# Patient Record
Sex: Male | Born: 2000 | Race: Black or African American | Hispanic: No | Marital: Single | Smoking: Former smoker
Health system: Southern US, Community
[De-identification: ages and names within clinical notes are randomized; demographics above are authoritative.]

## PROBLEM LIST (undated history)

## (undated) DIAGNOSIS — B20 Human immunodeficiency virus [HIV] disease: Principal | ICD-10-CM

## (undated) DIAGNOSIS — F909 Attention-deficit hyperactivity disorder, unspecified type: Secondary | ICD-10-CM

## (undated) HISTORY — DX: Attention-deficit hyperactivity disorder, unspecified type: F90.9

## (undated) HISTORY — DX: Human immunodeficiency virus (HIV) disease: B20

---
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
## 2019-02-13 ENCOUNTER — Other Ambulatory Visit: Payer: Self-pay | Admitting: Infectious Diseases

## 2019-02-13 DIAGNOSIS — Z21 Asymptomatic human immunodeficiency virus [HIV] infection status: Secondary | ICD-10-CM

## 2019-04-07 ENCOUNTER — Other Ambulatory Visit: Payer: Medicaid Other

## 2019-04-07 ENCOUNTER — Other Ambulatory Visit: Payer: Self-pay

## 2019-04-07 DIAGNOSIS — B20 Human immunodeficiency virus [HIV] disease: Secondary | ICD-10-CM | POA: Diagnosis not present

## 2019-04-07 DIAGNOSIS — Z21 Asymptomatic human immunodeficiency virus [HIV] infection status: Secondary | ICD-10-CM

## 2019-04-08 LAB — T-HELPER CELL (CD4) - (RCID CLINIC ONLY)
CD4 % Helper T Cell: 35 % (ref 33–65)
CD4 T Cell Abs: 655 /uL (ref 400–1790)

## 2019-04-10 LAB — CBC WITH DIFFERENTIAL/PLATELET
Absolute Monocytes: 342 cells/uL (ref 200–950)
Basophils Absolute: 18 cells/uL (ref 0–200)
Basophils Relative: 0.4 %
Eosinophils Absolute: 131 cells/uL (ref 15–500)
Eosinophils Relative: 2.9 %
HCT: 42 % (ref 38.5–50.0)
Hemoglobin: 14.1 g/dL (ref 13.2–17.1)
Lymphs Abs: 1832 cells/uL (ref 850–3900)
MCH: 28.9 pg (ref 27.0–33.0)
MCHC: 33.6 g/dL (ref 32.0–36.0)
MCV: 86.1 fL (ref 80.0–100.0)
MPV: 11.5 fL (ref 7.5–12.5)
Monocytes Relative: 7.6 %
Neutro Abs: 2178 cells/uL (ref 1500–7800)
Neutrophils Relative %: 48.4 %
Platelets: 205 10*3/uL (ref 140–400)
RBC: 4.88 10*6/uL (ref 4.20–5.80)
RDW: 12.4 % (ref 11.0–15.0)
Total Lymphocyte: 40.7 %
WBC: 4.5 10*3/uL (ref 3.8–10.8)

## 2019-04-10 LAB — HIV-1 RNA QUANT-NO REFLEX-BLD
HIV 1 RNA Quant: <20 copies/mL
HIV-1 RNA Quant, Log: <1.3 Log copies/mL

## 2019-04-10 LAB — RPR: RPR Ser Ql: NONREACTIVE

## 2019-04-21 ENCOUNTER — Telehealth: Payer: Self-pay

## 2019-04-21 NOTE — Telephone Encounter (Signed)
COVID-19 Pre-Screening Questions:04/21/19  Do you currently have a fever (>100 F), chills or unexplained body aches? NO  Are you currently experiencing new cough, shortness of breath, sore throat, runny nose?NO*  .  Have you recently travelled outside the state of [place] in the last 14 days? NO .  Have you been in contact with someone that is currently pending confirmation of Covid19 testing or has been confirmed to have the Covid19 virus? NO  **If the patient answers NO to ALL questions -  advise the patient to please call the clinic before coming to the office should any symptoms develop.     

## 2019-04-22 ENCOUNTER — Ambulatory Visit (INDEPENDENT_AMBULATORY_CARE_PROVIDER_SITE_OTHER): Payer: Medicaid Other | Admitting: Infectious Diseases

## 2019-04-22 ENCOUNTER — Encounter: Payer: Self-pay | Admitting: Infectious Diseases

## 2019-04-22 ENCOUNTER — Other Ambulatory Visit: Payer: Self-pay

## 2019-04-22 DIAGNOSIS — R234 Changes in skin texture: Secondary | ICD-10-CM

## 2019-04-22 DIAGNOSIS — Z21 Asymptomatic human immunodeficiency virus [HIV] infection status: Secondary | ICD-10-CM | POA: Diagnosis not present

## 2019-04-22 DIAGNOSIS — Z8619 Personal history of other infectious and parasitic diseases: Secondary | ICD-10-CM | POA: Diagnosis not present

## 2019-04-22 MED ORDER — SYMTUZA 800-150-200-10 MG PO TABS: 1.0000 | ORAL_TABLET | Freq: Every day | ORAL | 3 refills | Status: DC

## 2019-04-22 MED ORDER — TERBINAFINE HCL 1 % EX CREA: 1.0000 "application " | TOPICAL_CREAM | Freq: Two times a day (BID) | CUTANEOUS | 0 refills | Status: DC

## 2019-04-22 NOTE — Progress Notes (Signed)
Name: Jose Davies  DOB: 15-Aug-2000  MRN: [id number]  PCP: [facility]    Patient Active Problem List   Diagnosis Date Noted  . Fissure in skin of both feet 04/24/2019  . History of syphilis 03/10/2017  . Healthcare maintenance 02/02/2017  . HIV (human immunodeficiency virus infection) ([place]) 01/30/2017  . ADHD 01/30/2017   SUBJECTIVE: CC:  HIV follow up care. Rash on his feet.    HPI:  He is feeling well today aside from painful rash between his toes on right and left feet. This problem started 2 weeks ago and has gotten worse. No drainage to the area but the skin frequently stays white. He has tried OTC Goldbond Cream to the site daily without much effect. Has not worsened but not healing up. Notices that certain shoes cause it to worsen.  He works at [employer] and on his feet all day.   He continues his [place] everyday with food as instructed with very little missed doses if any. He has no side effects nor concerns with access. No sexual partners since last OV.   Has been trying to lose weight recently and down about 30 lbs with exercise and diet changes. He has been working on more plant based approach and limiting chicken/red meat. A friend of his is a vegan and he has been very interested to see how he will do with this lifestyle.   Has been considering cosmetic surgery - looking to do a liposuction / fat translocation procedure. Has been feeling out options around [place] and [place].    Review of Systems  Constitutional: Negative for chills, fever, malaise/fatigue and weight loss.  HENT: Negative for sore throat and tinnitus.        No dental problems  Eyes: Negative for blurred vision and photophobia.  Respiratory: Negative for cough, sputum production and shortness of breath.   Cardiovascular: Negative for chest pain and leg swelling.  Gastrointestinal: Negative for abdominal pain, constipation, diarrhea, nausea and vomiting.    Genitourinary: Negative for dysuria and flank pain.  Musculoskeletal: Negative for joint pain, myalgias and neck pain.  Skin: Positive for rash.  Neurological: Negative for dizziness, tingling, seizures and headaches.  Psychiatric/Behavioral: Negative for depression and substance abuse. The patient is not nervous/anxious and does not have insomnia.     Past Medical History:  Diagnosis Date  . ADHD   . HIV (human immunodeficiency virus infection) ([place]) 01/30/2017   No Known Allergies  Social History   Tobacco Use  . Smoking status: Never Smoker  . Smokeless tobacco: Never Used  Substance Use Topics  . Alcohol use: Yes    Alcohol/week: 1.0 standard drinks    Types: 1 Cans of beer per week  . Drug use: Yes    Types: Marijuana    Social History   Substance and Sexual Activity  Sexual Activity Not Currently  . Partners: Male  . Birth control/protection: None   Comment: declined condoms    Physical Exam and Objective Findings:  Vitals:   04/22/19 1016  BP: 117/73  Pulse: (!) 54  Weight: 207 lb (93.9 kg)   Body mass index is 30.57 kg/m.   Physical Exam Vitals reviewed.  Constitutional:      Appearance: He is well-developed.     Comments: Seated comfortably in chair during visit.   HENT:     Mouth/Throat:     Dentition: Normal dentition. No dental abscesses.  Cardiovascular:     Rate and Rhythm:  Normal rate and regular rhythm.     Heart sounds: Normal heart sounds.  Pulmonary:     Effort: Pulmonary effort is normal.     Breath sounds: Normal breath sounds.  Abdominal:     General: There is no distension.     Palpations: Abdomen is soft.     Tenderness: There is no abdominal tenderness.  Musculoskeletal:       Feet:  Lymphadenopathy:     Cervical: No cervical adenopathy.  Skin:    General: Skin is warm and dry.     Findings: No rash.  Neurological:     Mental Status: He is alert and oriented to person, place, and time.  Psychiatric:         Judgment: Judgment normal.     Comments: In good spirits today and engaged in care discussion.       Lab Results Lab Results  Component Value Date   WBC 4.5 04/07/2019   HGB 14.1 04/07/2019   HCT 42.0 04/07/2019   MCV 86.1 04/07/2019   PLT 205 04/07/2019    Lab Results  Component Value Date   CREATININE 1.09 03/04/2018   BUN 10 03/04/2018   NA 140 03/04/2018   K 4.4 03/04/2018   CL 105 03/04/2018   CO2 27 03/04/2018    Lab Results  Component Value Date   ALT 10 03/04/2018   AST 16 03/04/2018   ALKPHOS 87 01/03/2017   BILITOT 0.4 03/04/2018    Lab Results  Component Value Date   CHOL 171 (H) 01/30/2017   HDL 24 (L) 01/30/2017   LDLCALC 128 (H) 01/30/2017   TRIG 91 (H) 01/30/2017   CHOLHDL 7.1 (H) 01/30/2017   HIV 1 RNA Quant (copies/mL)  Date Value  04/07/2019 <20 NOT DETECTED  03/04/2018 <20 NOT DETECTED  09/01/2017 <20 NOT DETECTED   CD4 T Cell Abs (/uL)  Date Value  04/07/2019 655  03/04/2018 650  06/10/2017 740   Lab Results  Component Value Date   HAV REACTIVE (A) 01/30/2017   Lab Results  Component Value Date   HEPBSAG NON-REACTIVE 01/30/2017   HEPBSAB NON-REACTIVE 01/30/2017    Assessment & Plan:  Problem List Items Addressed This Visit      Unprioritized   HIV (human immunodeficiency virus infection) (HCC) (Chronic)    Under perfect control with Symtuza. He is taking it correctly and no interactions noted. STI screening up to date. Discussed U=U again. He needs Hep B booster but with desire to start COVID vaccination will hold off until he completes this - information discussed re: covid vaccine and information provided to schedule.  RTC in 4 months with labs prior to or at the visit per his discretion.       Relevant Medications   Darunavir-Cobicisctat-Emtricitabine-Tenofovir Alafenamide (SYMTUZA) 800-150-200-10 MG TABS   terbinafine (LAMISIL) 1 % cream   History of syphilis    RPR negative with re-screen. Counseled on preventing  re-infection today.       Fissure in skin of both feet    Macerated fissure that is painful to feet. Odor present. Concern for fungal involvement. Will have him do Lamisil gel between toes. Counseled on measures to reduce moisture (cotton socks, changing them during the day, breathable shoes, powder).         [name], MSN, NP-C [facility] for Infectious Disease [facility] Pager: [phone]  04/24/2019  9:03 AM

## 2019-04-22 NOTE — Patient Instructions (Addendum)
For your foot - I think they are too wet which is not letting that spot between your toes heal.   Goldbond Foot Powder to your feet before you put your socks on  Change of socks during the work day so they are dry  Use only boring cotton socks - they absorb moisture better   Will also use a cream for you twice a day. Thin layer, don't make it globby.   Shoes off as much as possible   When you do wear shoes make sure they are lighter and 'breathe' a bit.    For your COVID Vaccine -  There are 3 options out currently to consider:  1. Pfizer - 2 doses 3 weeks apart, 95% protective  2. Moderna - 2 doses 4 weeks apart, 95% protective  3. Johnsen & Johnsen - 1 dose, 70% protective   To Schedule at [place]:  To book an appointment, go to [email or web address]   OR  For the [employer] [facility] at [facility]:  Call [phone] to schedule your appointment     OR  Text "[email or web address]" to [phone] to get a link to schedule through the [facility] Department.   Please return in 4 months so we can check in with you again - we can do lab work again at the time of the visit.    Feel free to reach out with questions!

## 2019-04-24 DIAGNOSIS — R234 Changes in skin texture: Secondary | ICD-10-CM | POA: Insufficient documentation

## 2019-04-24 NOTE — Assessment & Plan Note (Addendum)
Under perfect control with Symtuza. He is taking it correctly and no interactions noted. STI screening up to date. Discussed U=U again. He needs Hep B booster but with desire to start COVID vaccination will hold off until he completes this - information discussed re: covid vaccine and information provided to schedule.  RTC in 4 months with labs prior to or at the visit per his discretion.

## 2019-04-24 NOTE — Assessment & Plan Note (Signed)
Macerated fissure that is painful to feet. Odor present. Concern for fungal involvement. Will have him do Lamisil gel between toes. Counseled on measures to reduce moisture (cotton socks, changing them during the day, breathable shoes, powder).

## 2019-04-24 NOTE — Assessment & Plan Note (Signed)
RPR negative with re-screen. Counseled on preventing re-infection today.

## 2019-04-29 ENCOUNTER — Ambulatory Visit: Payer: Medicaid Other | Attending: Internal Medicine

## 2019-04-29 DIAGNOSIS — Z23 Encounter for immunization: Secondary | ICD-10-CM

## 2019-04-29 NOTE — Progress Notes (Signed)
   Covid-19 Vaccination Clinic  Name:  Granger Chui    MRN: [id number] DOB: 01/07/2001  04/29/2019  Mr. Calame was observed post Covid-19 immunization for 15 minutes without incident. He was provided with Vaccine Information Sheet and instruction to access the V-Safe system.   Mr. Ottaviano was instructed to call 911 with any severe reactions post vaccine: Marland Kitchen Difficulty breathing  . Swelling of face and throat  . A fast heartbeat  . A bad rash all over body  . Dizziness and weakness   Immunizations Administered    Name Date Dose VIS Date Route   Pfizer COVID-19 Vaccine 04/29/2019 11:30 AM 0.3 mL 01/14/2019 Intramuscular   Manufacturer: [employer], [employer]   Lot: [id number]   NDC: [address]-6

## 2019-05-23 ENCOUNTER — Ambulatory Visit: Payer: Medicaid Other | Attending: Internal Medicine

## 2019-05-23 DIAGNOSIS — Z23 Encounter for immunization: Secondary | ICD-10-CM

## 2019-05-23 NOTE — Progress Notes (Signed)
   Covid-19 Vaccination Clinic  Name:  Jose Davies    MRN: [id number] DOB: [birth year]  05/23/2019  Mr. Jose Davies was observed post Covid-19 immunization for 15 minutes without incident. He was provided with Vaccine Information Sheet and instruction to access the V-Safe system.   Mr. Jose Davies was instructed to call 911 with any severe reactions post vaccine: Marland Kitchen Difficulty breathing  . Swelling of face and throat  . A fast heartbeat  . A bad rash all over body  . Dizziness and weakness   Immunizations Administered    Name Date Dose VIS Date Route   Pfizer COVID-19 Vaccine 05/23/2019  1:54 PM 0.3 mL 03/30/2018 Intramuscular   Manufacturer: [employer], [employer]   Lot: [id number]   NDC: [address]-4

## 2019-07-19 ENCOUNTER — Other Ambulatory Visit: Payer: Self-pay

## 2019-07-19 ENCOUNTER — Encounter: Payer: Self-pay | Admitting: Podiatry

## 2019-07-19 ENCOUNTER — Ambulatory Visit (INDEPENDENT_AMBULATORY_CARE_PROVIDER_SITE_OTHER): Payer: Medicaid Other | Admitting: Podiatry

## 2019-07-19 DIAGNOSIS — L988 Other specified disorders of the skin and subcutaneous tissue: Secondary | ICD-10-CM

## 2019-07-19 DIAGNOSIS — B351 Tinea unguium: Secondary | ICD-10-CM

## 2019-07-19 DIAGNOSIS — Z79899 Other long term (current) drug therapy: Secondary | ICD-10-CM

## 2019-07-19 DIAGNOSIS — B353 Tinea pedis: Secondary | ICD-10-CM

## 2019-07-19 MED ORDER — DRYSOL 20 % EX SOLN: CUTANEOUS | 0 refills | Status: DC

## 2019-07-19 MED ORDER — NAFTIFINE HCL 2 % EX GEL: 1.0000 "application " | Freq: Every day | CUTANEOUS | 1 refills | Status: DC

## 2019-07-19 NOTE — Patient Instructions (Signed)
Terbinafine oral granules What is this medicine? TERBINAFINE (TER bin a feen) is an antifungal medicine. It is used to treat certain kinds of fungal or yeast infections. This medicine may be used for other purposes; ask your health care provider or pharmacist if you have questions. COMMON BRAND NAME(S): Lamisil What should I tell my health care provider before I take this medicine? They need to know if you have any of these conditions:  drink alcoholic beverages  kidney disease  liver disease  an unusual or allergic reaction to Terbinafine, other medicines, foods, dyes, or preservatives  pregnant or trying to get pregnant  breast-feeding How should I use this medicine? Take this medicine by mouth. Follow the directions on the prescription label. Hold packet with cut line on top. Shake packet gently to settle contents. Tear packet open along cut line, or use scissors to cut across line. Carefully pour the entire contents of packet onto a spoonful of a soft food, such as pudding or other soft, non-acidic food such as mashed potatoes (do NOT use applesauce or a fruit-based food). If two packets are required for each dose, you may either sprinkle the content of both packets on one spoonful of non-acidic food, or sprinkle the contents of both packets on two spoonfuls of non-acidic food. Make sure that no granules remain in the packet. Swallow the mxiture of the food and granules without chewing. Take your medicine at regular intervals. Do not take it more often than directed. Take all of your medicine as directed even if you think you are better. Do not skip doses or stop your medicine early. Contact your pediatrician or health care professional regarding the use of this medicine in children. While this medicine may be prescribed for children as young as 4 years for selected conditions, precautions do apply. Overdosage: If you think you have taken too much of this medicine contact a poison control  center or emergency room at once. NOTE: This medicine is only for you. Do not share this medicine with others. What if I miss a dose? If you miss a dose, take it as soon as you can. If it is almost time for your next dose, take only that dose. Do not take double or extra doses. What may interact with this medicine? Do not take this medicine with any of the following medications:  thioridazine This medicine may also interact with the following medications:  beta-blockers  caffeine  cimetidine  cyclosporine  MAOIs like Carbex, Eldepryl, Marplan, Nardil, and Parnate  medicines for fungal infections like fluconazole and ketoconazole  medicines for irregular heartbeat like amiodarone, flecainide and propafenone  rifampin  SSRIs like citalopram, escitalopram, fluoxetine, fluvoxamine, paroxetine and sertraline  tricyclic antidepressants like amitriptyline, clomipramine, desipramine, imipramine, nortriptyline, and others  warfarin This list may not describe all possible interactions. Give your health care provider a list of all the medicines, herbs, non-prescription drugs, or dietary supplements you use. Also tell them if you smoke, drink alcohol, or use illegal drugs. Some items may interact with your medicine. What should I watch for while using this medicine? Your doctor may monitor your liver function. Tell your doctor right away if you have nausea or vomiting, loss of appetite, stomach pain on your right upper side, yellow skin, dark urine, light stools, or are over tired. This medicine may cause serious skin reactions. They can happen weeks to months after starting the medicine. Contact your health care provider right away if you notice fevers or flu-like symptoms   with a rash. The rash may be red or purple and then turn into blisters or peeling of the skin. Or, you might notice a red rash with swelling of the face, lips or lymph nodes in your neck or under your arms. You need to take  this medicine for 6 weeks or longer to cure the fungal infection. Take your medicine regularly for as long as your doctor or health care provider tells you to. What side effects may I notice from receiving this medicine? Side effects that you should report to your doctor or health care professional as soon as possible:  allergic reactions like skin rash or hives, swelling of the face, lips, or tongue  change in vision  dark urine  fever or infection  general ill feeling or flu-like symptoms  light-colored stools  loss of appetite, nausea  rash, fever, and swollen lymph nodes  redness, blistering, peeling or loosening of the skin, including inside the mouth  right upper belly pain  unusually weak or tired  yellowing of the eyes or skin Side effects that usually do not require medical attention (report to your doctor or health care professional if they continue or are bothersome):  changes in taste  diarrhea  hair loss  muscle or joint pain  stomach upset This list may not describe all possible side effects. Call your doctor for medical advice about side effects. You may report side effects to FDA at 1-800-FDA-1088. Where should I keep my medicine? Keep out of the reach of children. Store at room temperature between 15 and 30 degrees C (59 and 86 degrees F). Throw away any unused medicine after the expiration date. NOTE: This sheet is a summary. It may not cover all possible information. If you have questions about this medicine, talk to your doctor, pharmacist, or health care provider.  2020 Elsevier/Gold Standard (2018-04-30 15:35:11)  

## 2019-07-20 LAB — CBC WITH DIFFERENTIAL/PLATELET
Absolute Monocytes: 221 cells/uL (ref 200–950)
Basophils Absolute: 30 cells/uL (ref 0–200)
Basophils Relative: 0.9 %
Eosinophils Absolute: 271 cells/uL (ref 15–500)
Eosinophils Relative: 8.2 %
HCT: 44.3 % (ref 38.5–50.0)
Hemoglobin: 14.5 g/dL (ref 13.2–17.1)
Lymphs Abs: 1419 cells/uL (ref 850–3900)
MCH: 28.2 pg (ref 27.0–33.0)
MCHC: 32.7 g/dL (ref 32.0–36.0)
MCV: 86 fL (ref 80.0–100.0)
MPV: 11.3 fL (ref 7.5–12.5)
Monocytes Relative: 6.7 %
Neutro Abs: 1360 cells/uL — ABNORMAL LOW (ref 1500–7800)
Neutrophils Relative %: 41.2 %
Platelets: 230 10*3/uL (ref 140–400)
RBC: 5.15 10*6/uL (ref 4.20–5.80)
RDW: 12.1 % (ref 11.0–15.0)
Total Lymphocyte: 43 %
WBC: 3.3 10*3/uL — ABNORMAL LOW (ref 3.8–10.8)

## 2019-07-20 LAB — HEPATIC FUNCTION PANEL
AG Ratio: 2 (calc) (ref 1.0–2.5)
ALT: 16 U/L (ref 8–46)
AST: 19 U/L (ref 12–32)
Albumin: 4.5 g/dL (ref 3.6–5.1)
Alkaline phosphatase (APISO): 47 U/L (ref 46–169)
Bilirubin, Direct: 0.1 mg/dL (ref 0.0–0.2)
Globulin: 2.3 g/dL (calc) (ref 2.1–3.5)
Indirect Bilirubin: 0.2 mg/dL (calc) (ref 0.2–1.1)
Total Bilirubin: 0.3 mg/dL (ref 0.2–1.1)
Total Protein: 6.8 g/dL (ref 6.3–8.2)

## 2019-07-22 ENCOUNTER — Telehealth: Payer: Self-pay | Admitting: *Deleted

## 2019-07-22 MED ORDER — NONFORMULARY OR COMPOUNDED ITEM: 5 refills | Status: DC

## 2019-07-22 NOTE — Telephone Encounter (Signed)
Unable to leave message on pt's voicemail it had not been set up yet. Mailed letter of explanation of results and orders to pt. Faxed orders to [employer].

## 2019-07-22 NOTE — Telephone Encounter (Signed)
-----   Message from Vivi Barrack, DPM sent at 07/22/2019  8:45 AM EDT ----- Val- please let him know that the WBC is low and one of the rare side affects of oral Lamisil is that it can cause it to decrease. I would hold off on oral. I already did topical for skin fungus but can you please have him start Penlac on the nails. I don't want to do the [facility] combo as he does not need the urea.

## 2019-07-22 NOTE — Telephone Encounter (Signed)
That would be fine to do. The nails are thin so I didn't want urea on this patient. If they will do it without that would be a better option. Thank you.

## 2019-07-25 NOTE — Progress Notes (Signed)
Subjective:   Patient ID: Jose Davies, male   DOB: 19 y.o.   MRN: [id number]   HPI 19 year old presents the office today for concerns of calluses in between the fourth and fifth toes with the right side worse than left and the skin turn white.  Denies any pain to the area denies any drainage or pus.  Also concerned the nails are becoming discolored as well.  No pain in the nails and denies any redness or drainage or any swelling.  No other concerns today.   Review of Systems  All other systems reviewed and are negative.  Past Medical History:  Diagnosis Date  . ADHD   . HIV (human immunodeficiency virus infection) (HCC) 01/30/2017    History reviewed. No pertinent surgical history.   Current Outpatient Medications:  .  aluminum chloride (DRYSOL) 20 % external solution, Apply topically 2 (two) times a week., Disp: 35 mL, Rfl: 0 .  Darunavir-Cobicisctat-Emtricitabine-Tenofovir Alafenamide (SYMTUZA) 800-150-200-10 MG TABS, Take 1 tablet by mouth daily with breakfast., Disp: 30 tablet, Rfl: 3 .  ibuprofen (ADVIL,MOTRIN) 600 MG tablet, Take 600 mg by mouth every 6 (six) hours as needed., Disp: , Rfl:  .  Naftifine HCl 2 % GEL, Apply 1 application topically daily., Disp: 60 g, Rfl: 1 .  NONFORMULARY OR COMPOUNDED ITEM, [facility]:  Antifungal topical - Terbinafine 3%, Fluconazole 2%, Tea Tree Oil 5%, Ibuprofen 2%, in DMSO Suspension #84ml. Apply to affect toenail(s) once (at Bedtime) or twice daily., Disp: 30 each, Rfl: 5 .  terbinafine (LAMISIL) 1 % cream, Apply 1 application topically 2 (two) times daily., Disp: 30 g, Rfl: 0  No Known Allergies       Objective:  Physical Exam  General: AAO x3, NAD  Dermatological: Along the fourth and fifth toes on the sulcus is macerated tissue with hyperkeratotic tissue.  There is no drainage or pus identified today.  There is no edema, erythema or signs of infection.  Nails are mildly discolored with yellow, white discoloration.  No  pain in the nails no redness or drainage or any signs of infection otherwise.  Vascular: Dorsalis Pedis artery and Posterior Tibial artery pedal pulses are 2/4 bilateral with immedate capillary fill time.  There is no pain with calf compression, swelling, warmth, erythema.   Neruologic: Grossly intact via light touch bilateral.   Musculoskeletal: No gross boney pedal deformities bilateral. No pain, crepitus, or limitation noted with foot and ankle range of motion bilateral. Muscular strength 5/5 in all groups tested bilateral.  Gait: Unassisted, Nonantalgic.       Assessment:   Interdigital maceration, tinea pedis; onychomycosis     Plan:  -Treatment options discussed including all alternatives, risks, and complications -Etiology of symptoms were discussed -I debrided the hyperkeratotic tissue interdigitally without complications.  Described Naftin gel.  Also will check a CBC and LFT for oral Lamisil to treat the skin as well as the nail fungus.  Discussed side effects of medication he wishes to proceed with oral options, Lamisil.  Return in about 6 weeks (around 08/30/2019).  Vivi Barrack DPM

## 2019-08-29 ENCOUNTER — Ambulatory Visit: Payer: Medicaid Other | Admitting: Infectious Diseases

## 2019-08-30 ENCOUNTER — Ambulatory Visit: Payer: Medicaid Other | Admitting: Podiatry

## 2019-09-02 ENCOUNTER — Ambulatory Visit: Payer: Medicaid Other | Admitting: Infectious Diseases

## 2019-09-06 ENCOUNTER — Encounter: Payer: Self-pay | Admitting: Infectious Diseases

## 2019-09-06 ENCOUNTER — Other Ambulatory Visit: Payer: Self-pay

## 2019-09-06 ENCOUNTER — Ambulatory Visit (INDEPENDENT_AMBULATORY_CARE_PROVIDER_SITE_OTHER): Payer: Medicaid Other | Admitting: Infectious Diseases

## 2019-09-06 VITALS — BP 136/81 | HR 59 | Wt 202.2 lb

## 2019-09-06 DIAGNOSIS — B351 Tinea unguium: Secondary | ICD-10-CM | POA: Insufficient documentation

## 2019-09-06 DIAGNOSIS — Z79899 Other long term (current) drug therapy: Secondary | ICD-10-CM | POA: Diagnosis not present

## 2019-09-06 DIAGNOSIS — Z23 Encounter for immunization: Secondary | ICD-10-CM | POA: Diagnosis not present

## 2019-09-06 DIAGNOSIS — Z8619 Personal history of other infectious and parasitic diseases: Secondary | ICD-10-CM | POA: Diagnosis not present

## 2019-09-06 DIAGNOSIS — Z21 Asymptomatic human immunodeficiency virus [HIV] infection status: Secondary | ICD-10-CM

## 2019-09-06 MED ORDER — BICTEGRAVIR-EMTRICITAB-TENOFOV 50-200-25 MG PO TABS: 1.0000 | ORAL_TABLET | Freq: Every day | ORAL | 5 refills | Status: DC

## 2019-09-06 MED ORDER — TERBINAFINE HCL 250 MG PO TABS: 250.0000 mg | ORAL_TABLET | Freq: Every day | ORAL | 2 refills | Status: DC

## 2019-09-06 NOTE — Assessment & Plan Note (Addendum)
Will switch to [place] once daily given he is having trouble with food schedule and keeping this consistent lately.  Counseled on possible DDIs with multivitamins, Tums, Rolaids, etc. He does not use these and if added will be mindful to separate > 4 hours.  Will update CD4 / VL today and repeat VL in 4- weeks at return with liver monitoring.  Vaccines updated today.  STI screening politely declined.  Otherwise he can RTC in 54m for routine care.

## 2019-09-06 NOTE — Progress Notes (Signed)
Name: Jose Davies  DOB: [birth year]  MRN: [id number]  PCP: [facility]    Patient Active Problem List   Diagnosis Date Noted  . Fissure in skin of both feet 04/24/2019  . History of syphilis 03/10/2017  . Healthcare maintenance 02/02/2017  . HIV (human immunodeficiency virus infection) (HCC) 01/30/2017  . ADHD 01/30/2017   SUBJECTIVE: CC:  HIV follow up care. Toenail fungus  Heartburn    HPI He continues his Symtuza everyday with food although it is getting harder with eating schedule. No missed doses. Not sexually active at present nor since last visit.   Sleep schedule is much better. Has had increased stress due to new job he is not comfortable with yet.   Trouble with heartburn - has noticed it more and more lately where he gets an episode randomly with or without food throughout the day that lasts about 10 minutes. Refractory to milk / water. Has not tried any medication. Often present upon waking in the AM and toothpaste/mint aggravates it too. Feels often that he needs to belch. No abdominal pain or bloating. He does not drink sodas, does not smoke tobacco products. Does drink alcohol intermittently.   He saw a podiatrist for toenail fungus and was not able to use oral antifungal due to possible interaction with Symtuza; was given cream and unable to afford it as it was not covered with Medicaid. He has a black thick toenail on right foot that is cosmetically bothersome to him.   Bumpy rash to skin on upper arms. Has not tried anything for it. Non-pruritic. No drainage. Never seems to go away.   Review of Systems  Constitutional: Negative for activity change, chills, fatigue and unexpected weight change.  HENT: Negative for dental problem.   Respiratory: Negative for cough and shortness of breath.   Cardiovascular: Positive for chest pain (with heart burn).  Gastrointestinal: Negative for abdominal distention, abdominal pain, diarrhea and  nausea.  Genitourinary: Negative for difficulty urinating.  Musculoskeletal: Negative for arthralgias and back pain.  Neurological: Negative for dizziness.  Psychiatric/Behavioral: Negative for dysphoric mood and sleep disturbance. The patient is not nervous/anxious.      Past Medical History:  Diagnosis Date  . ADHD   . HIV (human immunodeficiency virus infection) (HCC) 01/30/2017   No Known Allergies  Social History   Tobacco Use  . Smoking status: Never Smoker  . Smokeless tobacco: Never Used  Vaping Use  . Vaping Use: Never used  Substance Use Topics  . Alcohol use: Yes    Alcohol/week: 1.0 standard drink    Types: 1 Cans of beer per week    Comment: Weekends  . Drug use: Yes    Frequency: 7.0 times per week    Types: Marijuana    Social History   Substance and Sexual Activity  Sexual Activity Not Currently  . Partners: Male  . Birth control/protection: None   Comment: declined condoms    Physical Exam and Objective Findings:  Vitals:   09/06/19 0915  BP: 136/81  Pulse: (!) 59  SpO2: 98%  Weight: 202 lb 3.2 oz (91.7 kg)   Body mass index is 29.86 kg/m.   Physical Exam Vitals reviewed.  HENT:     Mouth/Throat:     Mouth: Mucous membranes are moist.     Pharynx: No oropharyngeal exudate.  Eyes:     General: No scleral icterus.    Pupils: Pupils are equal, round, and reactive to light.  Cardiovascular:     Rate and Rhythm: Normal rate.     Pulses: Normal pulses.     Heart sounds: No murmur heard.   Pulmonary:     Effort: Pulmonary effort is normal.     Breath sounds: Normal breath sounds.  Abdominal:     General: Bowel sounds are normal. There is no distension.     Palpations: Abdomen is soft.     Tenderness: There is no abdominal tenderness.  Skin:    General: Skin is warm and dry.     Capillary Refill: Capillary refill takes less than 2 seconds.     Comments: Scattered bumpy papules along outer aspects of the arms. No erythema. Some  hyperpigmented.  R #2 toe with thickened, brittle appearance and black streaking.   Neurological:     Mental Status: He is alert and oriented to person, place, and time.  Psychiatric:        Mood and Affect: Mood normal.        Behavior: Behavior normal.      Lab Results Lab Results  Component Value Date   WBC 3.3 (L) 07/19/2019   HGB 14.5 07/19/2019   HCT 44.3 07/19/2019   MCV 86.0 07/19/2019   PLT 230 07/19/2019    Lab Results  Component Value Date   CREATININE 1.09 03/04/2018   BUN 10 03/04/2018   NA 140 03/04/2018   K 4.4 03/04/2018   CL 105 03/04/2018   CO2 27 03/04/2018    Lab Results  Component Value Date   ALT 16 07/19/2019   AST 19 07/19/2019   ALKPHOS 87 01/03/2017   BILITOT 0.3 07/19/2019    Lab Results  Component Value Date   CHOL 171 (H) 01/30/2017   HDL 24 (L) 01/30/2017   LDLCALC 128 (H) 01/30/2017   TRIG 91 (H) 01/30/2017   CHOLHDL 7.1 (H) 01/30/2017   HIV 1 RNA Quant (copies/mL)  Date Value  04/07/2019 <20 NOT DETECTED  03/04/2018 <20 NOT DETECTED  09/01/2017 <20 NOT DETECTED   CD4 T Cell Abs (/uL)  Date Value  04/07/2019 655  03/04/2018 650  06/10/2017 740   Lab Results  Component Value Date   HAV REACTIVE (A) 01/30/2017   Lab Results  Component Value Date   HEPBSAG NON-REACTIVE 01/30/2017   HEPBSAB NON-REACTIVE 01/30/2017    Assessment & Plan:  Problem List Items Addressed This Visit    None     [name], MSN, NP-C [facility] Pager: [phone]  09/06/2019  9:29 AM

## 2019-09-06 NOTE — Assessment & Plan Note (Signed)
While there is a slight increased risk with cobicistat in Symtuza and Terbinafine, would be comfortable with him receiving both with close monitoring. However we are changing HIV regimen anyway today that has no interactions.  Counseled to avoid any binging or frequent alcohol with medication.  Will have him back in 4 weeks to check LFTs.  Will give him 12 weeks.

## 2019-09-06 NOTE — Assessment & Plan Note (Signed)
Seroreverted with negative RPR - will continue to monitor yearly.

## 2019-09-06 NOTE — Patient Instructions (Addendum)
For your skin - try either Gold Bond Rough and Bumpy skin lotion or Amlactin Lotion   STOP your Symtuza   START Biktarvy once a day.  Biktarvy is the pill I would like for you to start taking to treat you - this will need to be taken once a day around the same time.  - Common side effects for a short time frame usually include headaches, nausea and diarrhea - OK to take over the counter tylenol for headaches and imodium for diarrhea - Try taking with food if you are nauseated  - If you take any multivitamins or supplements please separate them from your Biktarvy by 6 hours before and after.  The main thing is do not have them in the stomach at the same time.   Will have you take the Terbinafine once daily for 12 weeks. Will have you back in 4 weeks to check your liver to ensure all is well and viral load with medication switch.   Please come back to see me in 3 months.   Terbinafine tablets What is this medicine? TERBINAFINE (TER bin a feen) is an antifungal medicine. It is used to treat certain kinds of fungal or yeast infections. This medicine may be used for other purposes; ask your health care provider or pharmacist if you have questions. COMMON BRAND NAME(S): Lamisil, Terbinex What should I tell my health care provider before I take this medicine? They need to know if you have any of these conditions:  drink alcoholic beverages  kidney disease  liver disease  an unusual or allergic reaction to terbinafine, other medicines, foods, dyes, or preservatives  pregnant or trying to get pregnant  breast-feeding How should I use this medicine? Take this medicine by mouth with a full glass of water. Follow the directions on the prescription label. You can take this medicine with food or on an empty stomach. Take your medicine at regular intervals. Do not take your medicine more often than directed. Do not skip doses or stop your medicine early even if you feel better. Do not stop  taking except on your doctor's advice. Talk to your pediatrician regarding the use of this medicine in children. Special care may be needed. Overdosage: If you think you have taken too much of this medicine contact a poison control center or emergency room at once. NOTE: This medicine is only for you. Do not share this medicine with others. What if I miss a dose? If you miss a dose, take it as soon as you can. If it is almost time for your next dose, take only that dose. Do not take double or extra doses. What may interact with this medicine? Do not take this medicine with any of the following medications:  thioridazine This medicine may also interact with the following medications:  beta-blockers  caffeine  cimetidine  cyclosporine  medicines for depression, anxiety, or psychotic disturbances  medicines for fungal infections like fluconazole and ketoconazole  medicines for irregular heartbeat like amiodarone, flecainide and propafenone  rifampin  warfarin This list may not describe all possible interactions. Give your health care provider a list of all the medicines, herbs, non-prescription drugs, or dietary supplements you use. Also tell them if you smoke, drink alcohol, or use illegal drugs. Some items may interact with your medicine. What should I watch for while using this medicine? Visit your doctor or health care provider regularly. Tell your doctor right away if you have nausea or vomiting, loss of appetite,  stomach pain on your right upper side, yellow skin, dark urine, light stools, or are over tired. Some fungal infections need many weeks or months of treatment to cure. If you are taking this medicine for a long time, you will need to have important blood work done. This medicine may cause serious skin reactions. They can happen weeks to months after starting the medicine. Contact your health care provider right away if you notice fevers or flu-like symptoms with a rash. The  rash may be red or purple and then turn into blisters or peeling of the skin. Or, you might notice a red rash with swelling of the face, lips or lymph nodes in your neck or under your arms. What side effects may I notice from receiving this medicine? Side effects that you should report to your doctor or health care professional as soon as possible:  allergic reactions like skin rash or hives, swelling of the face, lips, or tongue  changes in vision  dark urine  fever or infection  general ill feeling or flu-like symptoms  light-colored stools  loss of appetite, nausea  rash, fever, and swollen lymph nodes  redness, blistering, peeling or loosening of the skin, including inside the mouth  right upper belly pain  unusually weak or tired  yellowing of the eyes or skin Side effects that usually do not require medical attention (report to your doctor or health care professional if they continue or are bothersome):  changes in taste  diarrhea  hair loss  muscle or joint pain  stomach gas  stomach upset This list may not describe all possible side effects. Call your doctor for medical advice about side effects. You may report side effects to FDA at 1-800-FDA-1088. Where should I keep my medicine? Keep out of the reach of children. Store at room temperature below 25 degrees C (77 degrees F). Protect from light. Throw away any unused medicine after the expiration date. NOTE: This sheet is a summary. It may not cover all possible information. If you have questions about this medicine, talk to your doctor, pharmacist, or health care provider.  2020 Elsevier/Gold Standard (2018-04-30 15:37:07)

## 2019-09-07 LAB — T-HELPER CELL (CD4) - (RCID CLINIC ONLY)
CD4 % Helper T Cell: 41 % (ref 33–65)
CD4 T Cell Abs: 643 /uL (ref 400–1790)

## 2019-09-12 LAB — HIV-1 RNA QUANT-NO REFLEX-BLD
HIV 1 RNA Quant: <20 Copies/mL
HIV-1 RNA Quant, Log: <1.3 Log cps/mL

## 2019-10-07 ENCOUNTER — Other Ambulatory Visit: Payer: Medicaid Other

## 2019-10-07 ENCOUNTER — Other Ambulatory Visit: Payer: Self-pay

## 2019-10-07 DIAGNOSIS — Z79899 Other long term (current) drug therapy: Secondary | ICD-10-CM

## 2019-10-07 DIAGNOSIS — Z21 Asymptomatic human immunodeficiency virus [HIV] infection status: Secondary | ICD-10-CM

## 2019-10-10 LAB — HEPATIC FUNCTION PANEL
AG Ratio: 1.9 (calc) (ref 1.0–2.5)
ALT: 14 U/L (ref 8–46)
AST: 17 U/L (ref 12–32)
Albumin: 4.3 g/dL (ref 3.6–5.1)
Alkaline phosphatase (APISO): 40 U/L — ABNORMAL LOW (ref 46–169)
Bilirubin, Direct: 0.1 mg/dL (ref 0.0–0.2)
Globulin: 2.3 g/dL (calc) (ref 2.1–3.5)
Indirect Bilirubin: 0.2 mg/dL (calc) (ref 0.2–1.1)
Total Bilirubin: 0.3 mg/dL (ref 0.2–1.1)
Total Protein: 6.6 g/dL (ref 6.3–8.2)

## 2019-10-10 LAB — HIV-1 RNA QUANT-NO REFLEX-BLD
HIV 1 RNA Quant: <20 Copies/mL
HIV-1 RNA Quant, Log: <1.3 Log cps/mL

## 2019-11-30 ENCOUNTER — Other Ambulatory Visit: Payer: Self-pay

## 2019-11-30 DIAGNOSIS — B20 Human immunodeficiency virus [HIV] disease: Secondary | ICD-10-CM

## 2019-11-30 DIAGNOSIS — Z113 Encounter for screening for infections with a predominantly sexual mode of transmission: Secondary | ICD-10-CM

## 2019-11-30 DIAGNOSIS — Z79899 Other long term (current) drug therapy: Secondary | ICD-10-CM

## 2019-12-07 ENCOUNTER — Other Ambulatory Visit: Payer: Self-pay

## 2019-12-07 ENCOUNTER — Other Ambulatory Visit: Payer: Medicaid Other

## 2019-12-07 ENCOUNTER — Other Ambulatory Visit (HOSPITAL_COMMUNITY)
Admission: RE | Admit: 2019-12-07 | Discharge: 2019-12-07 | Disposition: A | Discharge status: Home / Self Care | Payer: Medicaid Other | Source: Ambulatory Visit | Attending: Infectious Diseases | Admitting: Infectious Diseases

## 2019-12-07 DIAGNOSIS — Z113 Encounter for screening for infections with a predominantly sexual mode of transmission: Secondary | ICD-10-CM | POA: Insufficient documentation

## 2019-12-07 DIAGNOSIS — B20 Human immunodeficiency virus [HIV] disease: Secondary | ICD-10-CM | POA: Diagnosis not present

## 2019-12-07 DIAGNOSIS — Z79899 Other long term (current) drug therapy: Secondary | ICD-10-CM | POA: Diagnosis not present

## 2019-12-08 LAB — T-HELPER CELL (CD4) - (RCID CLINIC ONLY)
CD4 % Helper T Cell: 41 % (ref 33–65)
CD4 T Cell Abs: 687 /uL (ref 400–1790)

## 2019-12-08 LAB — URINE CYTOLOGY ANCILLARY ONLY
Chlamydia: NEGATIVE
Comment: NEGATIVE
Comment: NORMAL
Neisseria Gonorrhea: NEGATIVE

## 2019-12-10 LAB — CBC WITH DIFFERENTIAL/PLATELET
Absolute Monocytes: 254 cells/uL (ref 200–950)
Basophils Absolute: 49 cells/uL (ref 0–200)
Basophils Relative: 0.9 %
Eosinophils Absolute: 1156 cells/uL — ABNORMAL HIGH (ref 15–500)
Eosinophils Relative: 21.4 %
HCT: 45 % (ref 38.5–50.0)
Hemoglobin: 14.9 g/dL (ref 13.2–17.1)
Lymphs Abs: 1901 cells/uL (ref 850–3900)
MCH: 28.7 pg (ref 27.0–33.0)
MCHC: 33.1 g/dL (ref 32.0–36.0)
MCV: 86.5 fL (ref 80.0–100.0)
MPV: 11.5 fL (ref 7.5–12.5)
Monocytes Relative: 4.7 %
Neutro Abs: 2041 cells/uL (ref 1500–7800)
Neutrophils Relative %: 37.8 %
Platelets: 220 10*3/uL (ref 140–400)
RBC: 5.2 10*6/uL (ref 4.20–5.80)
RDW: 12.3 % (ref 11.0–15.0)
Total Lymphocyte: 35.2 %
WBC: 5.4 10*3/uL (ref 3.8–10.8)

## 2019-12-10 LAB — COMPLETE METABOLIC PANEL WITH GFR
AG Ratio: 2 (calc) (ref 1.0–2.5)
ALT: 13 U/L (ref 8–46)
AST: 17 U/L (ref 12–32)
Albumin: 4.3 g/dL (ref 3.6–5.1)
Alkaline phosphatase (APISO): 36 U/L — ABNORMAL LOW (ref 46–169)
BUN: 17 mg/dL (ref 7–20)
CO2: 28 mmol/L (ref 20–32)
Calcium: 9.6 mg/dL (ref 8.9–10.4)
Chloride: 106 mmol/L (ref 98–110)
Creat: 1.05 mg/dL (ref 0.60–1.26)
GFR, Est African American: 119 mL/min/{1.73_m2} (ref >=60)
GFR, Est Non African American: 102 mL/min/{1.73_m2} (ref >=60)
Globulin: 2.2 g/dL (calc) (ref 2.1–3.5)
Glucose, Bld: 81 mg/dL (ref 65–99)
Potassium: 4.4 mmol/L (ref 3.8–5.1)
Sodium: 139 mmol/L (ref 135–146)
Total Bilirubin: 0.4 mg/dL (ref 0.2–1.1)
Total Protein: 6.5 g/dL (ref 6.3–8.2)

## 2019-12-10 LAB — LIPID PANEL
Cholesterol: 183 mg/dL — ABNORMAL HIGH (ref <170)
HDL: 65 mg/dL (ref >45)
LDL Cholesterol (Calc): 104 mg/dL (calc) (ref <110)
Non-HDL Cholesterol (Calc): 118 mg/dL (calc) (ref <120)
Total CHOL/HDL Ratio: 2.8 (calc) (ref <5.0)
Triglycerides: 53 mg/dL (ref <90)

## 2019-12-10 LAB — RPR: RPR Ser Ql: NONREACTIVE

## 2019-12-10 LAB — HIV-1 RNA QUANT-NO REFLEX-BLD
HIV 1 RNA Quant: <20 Copies/mL
HIV-1 RNA Quant, Log: <1.3 Log cps/mL

## 2019-12-13 DIAGNOSIS — M79676 Pain in unspecified toe(s): Secondary | ICD-10-CM

## 2019-12-15 ENCOUNTER — Other Ambulatory Visit: Payer: Self-pay | Admitting: Infectious Diseases

## 2019-12-21 ENCOUNTER — Encounter: Payer: Self-pay | Admitting: Infectious Diseases

## 2019-12-21 ENCOUNTER — Ambulatory Visit: Payer: Medicaid Other | Admitting: Infectious Diseases

## 2019-12-21 ENCOUNTER — Other Ambulatory Visit: Payer: Self-pay

## 2019-12-21 VITALS — BP 117/74 | HR 60 | Temp 98.0°F | Resp 16 | Ht 69.0 in | Wt 212.9 lb

## 2019-12-21 DIAGNOSIS — Z21 Asymptomatic human immunodeficiency virus [HIV] infection status: Secondary | ICD-10-CM | POA: Diagnosis not present

## 2019-12-21 DIAGNOSIS — F909 Attention-deficit hyperactivity disorder, unspecified type: Secondary | ICD-10-CM

## 2019-12-21 DIAGNOSIS — Z23 Encounter for immunization: Secondary | ICD-10-CM

## 2019-12-21 DIAGNOSIS — B351 Tinea unguium: Secondary | ICD-10-CM | POA: Diagnosis not present

## 2019-12-21 MED ORDER — BICTEGRAVIR-EMTRICITAB-TENOFOV 50-200-25 MG PO TABS: 1.0000 | ORAL_TABLET | Freq: Every day | ORAL | 5 refills | Status: DC

## 2019-12-21 NOTE — Assessment & Plan Note (Signed)
Discussed different tactics to help identify and prioritize tasks today.  Medication, quiet sessions, walking outside without stimulus--> decrease external stimulation.

## 2019-12-21 NOTE — Patient Instructions (Addendum)
Please continue your [place] everyday.   We gave you your flu shot today.   Please come back in 6 months with labs same day.   Hope you have a great rest of the year!

## 2019-12-21 NOTE — Assessment & Plan Note (Signed)
Doing well on terbinafine - one more month. LFTs normal with surveillance check.

## 2019-12-21 NOTE — Assessment & Plan Note (Signed)
Doing well on Biktarvy since switch. Has picked up a few pounds but more attributed by him d/t not going back to gym and eating more food out with new job.  STI screen negative recently.  Flu shot given today He is comfortable with twice yearly visits. Will do labs at next appointment to help him reduce time needed off work.

## 2019-12-21 NOTE — Progress Notes (Signed)
Name: Jose Davies  DOB: [birth year]  MRN: [id number]  PCP: [facility]    Patient Active Problem List   Diagnosis Date Noted  . Onychomycosis of toenail 09/06/2019  . High risk medication use 09/06/2019  . History of syphilis 03/10/2017  . Healthcare maintenance 02/02/2017  . HIV (human immunodeficiency virus infection) (HCC) 01/30/2017  . ADHD 01/30/2017   SUBJECTIVE: Chief Complaint  Patient presents with  . Follow-up    B20      HPI He is doing well on Biktarvy and has not even noticed the switch in his medication. No side effects to report and he appreciates the smaller pill. 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 Enjoys the work, would like to be a Financial controller one day.   Wants some suggestions about how to deal with ADHD tendencies off medications - he does not want to go back on ADHD medication.   Toenail fungus is about gone - has 1 more fill on the antifungal medication.    Review of Systems  Constitutional: Negative for appetite change, chills, fatigue, fever and unexpected weight change.  Eyes: Negative for visual disturbance.  Respiratory: Negative for cough and shortness of breath.   Cardiovascular: Negative for chest pain and leg swelling.  Gastrointestinal: Negative for abdominal pain, diarrhea and nausea.  Genitourinary: Negative for discharge, dysuria and genital sores.  Musculoskeletal: Negative for joint swelling.  Skin: Negative for color change and rash.  Neurological: Negative for dizziness and headaches.  Hematological: Negative for adenopathy.  Psychiatric/Behavioral: Negative for sleep disturbance. The patient is not nervous/anxious.      Past Medical History:  Diagnosis Date  . ADHD   . HIV (human immunodeficiency virus infection) (HCC) 01/30/2017   No Known Allergies  Social History   Tobacco Use  . Smoking status: Never Smoker  . Smokeless tobacco: Never Used  Vaping Use  . Vaping Use: Never  used  Substance Use Topics  . Alcohol use: Yes    Alcohol/week: 1.0 standard drink    Types: 1 Cans of beer per week    Comment: Weekends  . Drug use: Yes    Frequency: 7.0 times per week    Types: Marijuana    Social History   Substance and Sexual Activity  Sexual Activity Not Currently  . Partners: Male  . Birth control/protection: None   Comment: declined condoms    Physical Exam and Objective Findings:  Vitals:   12/21/19 1500  BP: 117/74  Pulse: 60  Resp: 16  Temp: 98 F (36.7 C)  TempSrc: Oral  SpO2: 98%  Weight: 212 lb 14.4 oz (96.6 kg)  Height: 5\' 9"  (1.753 m)   Body mass index is 31.44 kg/m.   Physical Exam Constitutional:      Appearance: Normal appearance. He is not ill-appearing.  HENT:     Head: Normocephalic.     Mouth/Throat:     Mouth: Mucous membranes are moist.     Pharynx: Oropharynx is clear.  Eyes:     General: No scleral icterus. Pulmonary:     Effort: Pulmonary effort is normal.  Musculoskeletal:        General: Normal range of motion.     Cervical back: Normal range of motion.  Skin:    Coloration: Skin is not jaundiced or pale.  Neurological:     Mental Status: He is alert and oriented to person, place, and time.  Psychiatric:  Mood and Affect: Mood normal.        Judgment: Judgment normal.     Lab Results Lab Results  Component Value Date   WBC 5.4 12/07/2019   HGB 14.9 12/07/2019   HCT 45.0 12/07/2019   MCV 86.5 12/07/2019   PLT 220 12/07/2019    Lab Results  Component Value Date   CREATININE 1.05 12/07/2019   BUN 17 12/07/2019   NA 139 12/07/2019   K 4.4 12/07/2019   CL 106 12/07/2019   CO2 28 12/07/2019    Lab Results  Component Value Date   ALT 13 12/07/2019   AST 17 12/07/2019   ALKPHOS 87 01/03/2017   BILITOT 0.4 12/07/2019    Lab Results  Component Value Date   CHOL 183 (H) 12/07/2019   HDL 65 12/07/2019   LDLCALC 104 12/07/2019   TRIG 53 12/07/2019   CHOLHDL 2.8 12/07/2019   HIV 1  RNA Quant (Copies/mL)  Date Value  12/07/2019 <20  10/07/2019 <20  09/06/2019 <20   CD4 T Cell Abs (/uL)  Date Value  12/07/2019 687  09/06/2019 643  04/07/2019 655   Lab Results  Component Value Date   HAV REACTIVE (A) 01/30/2017   Lab Results  Component Value Date   HEPBSAG NON-REACTIVE 01/30/2017   HEPBSAB NON-REACTIVE 01/30/2017    Assessment & Plan:  Problem List Items Addressed This Visit      Unprioritized   Onychomycosis of toenail    Doing well on terbinafine - one more month. LFTs normal with surveillance check.       Relevant Medications   bictegravir-emtricitabine-tenofovir AF (BIKTARVY) 50-200-25 MG TABS tablet   HIV (human immunodeficiency virus infection) (HCC) (Chronic)    Doing well on Biktarvy since switch. Has picked up a few pounds but more attributed by him d/t not going back to gym and eating more food out with new job.  STI screen negative recently.  Flu shot given today He is comfortable with twice yearly visits. Will do labs at next appointment to help him reduce time needed off work.       Relevant Medications   bictegravir-emtricitabine-tenofovir AF (BIKTARVY) 50-200-25 MG TABS tablet   ADHD    Discussed different tactics to help identify and prioritize tasks today.  Medication, quiet sessions, walking outside without stimulus--> decrease external stimulation.        Other Visit Diagnoses    Need for immunization against influenza    -  Primary   Relevant Orders   Flu Vaccine QUAD 36+ mos IM (Completed)     [name], MSN, NP-C [facility] Pager: [phone]  12/21/2019  3:35 PM

## 2019-12-22 ENCOUNTER — Encounter: Payer: Medicaid Other | Admitting: Infectious Diseases

## 2020-03-14 ENCOUNTER — Other Ambulatory Visit: Payer: Self-pay | Admitting: Infectious Diseases

## 2020-03-19 DIAGNOSIS — L03031 Cellulitis of right toe: Secondary | ICD-10-CM | POA: Diagnosis not present

## 2020-03-19 DIAGNOSIS — B351 Tinea unguium: Secondary | ICD-10-CM | POA: Diagnosis not present

## 2020-03-19 DIAGNOSIS — L03032 Cellulitis of left toe: Secondary | ICD-10-CM | POA: Diagnosis not present

## 2020-03-19 DIAGNOSIS — L600 Ingrowing nail: Secondary | ICD-10-CM | POA: Diagnosis not present

## 2020-03-19 DIAGNOSIS — M24575 Contracture, left foot: Secondary | ICD-10-CM | POA: Diagnosis not present

## 2020-03-19 DIAGNOSIS — M24574 Contracture, right foot: Secondary | ICD-10-CM | POA: Diagnosis not present

## 2020-06-07 ENCOUNTER — Encounter: Payer: Self-pay | Admitting: Infectious Diseases

## 2020-06-07 ENCOUNTER — Ambulatory Visit (INDEPENDENT_AMBULATORY_CARE_PROVIDER_SITE_OTHER): Payer: Medicaid Other | Admitting: Infectious Diseases

## 2020-06-07 ENCOUNTER — Other Ambulatory Visit: Payer: Self-pay

## 2020-06-07 VITALS — BP 124/83 | HR 67 | Resp 16 | Ht 69.0 in | Wt 199.8 lb

## 2020-06-07 DIAGNOSIS — Z8619 Personal history of other infectious and parasitic diseases: Secondary | ICD-10-CM | POA: Diagnosis not present

## 2020-06-07 DIAGNOSIS — Z21 Asymptomatic human immunodeficiency virus [HIV] infection status: Secondary | ICD-10-CM

## 2020-06-07 DIAGNOSIS — B351 Tinea unguium: Secondary | ICD-10-CM

## 2020-06-07 DIAGNOSIS — Z113 Encounter for screening for infections with a predominantly sexual mode of transmission: Secondary | ICD-10-CM

## 2020-06-07 DIAGNOSIS — Z0000 Encounter for general adult medical examination without abnormal findings: Secondary | ICD-10-CM

## 2020-06-07 MED ORDER — BICTEGRAVIR-EMTRICITAB-TENOFOV 50-200-25 MG PO TABS: 1.0000 | ORAL_TABLET | Freq: Every day | ORAL | 5 refills | Status: DC

## 2020-06-07 NOTE — Assessment & Plan Note (Signed)
Doing well with Biktarvy without any concerns over side effects. Counseled re: adherence and concern to reduce risk for viral mutations that would impact his treatment options in the future. Will update pertinent labs today including STI screening.  Refills for biktarvy provided - he will need ADAP bridging at 20 yo when he loses his current medicaid. We also spent time discussing a plan for obtaining and staying on medication while out of town with his work.   Return in about 4 months (around 10/08/2020).

## 2020-06-07 NOTE — Assessment & Plan Note (Signed)
Discussed COVID booster - he will consider at upcoming appt. Has had 2 vaccines and recovered from natural infection in January 2022.

## 2020-06-07 NOTE — Patient Instructions (Signed)
Please continue your Biktarvy everyday  Tips for Successful Daily Medication Habits: 1. Set a reminder on your phone  2. Try filling out a pill box for the week - pick a day and put one pill for every day during the week so you know right away if you missed a pill.  3. Have a trusted family member ask you about your medications.  4. Smartphone app   Remember every pill is precious and we want to be able to continue treating your with confidence the rest of your life.    See you back in 4 months! Good luck with your new job

## 2020-06-07 NOTE — Progress Notes (Signed)
Name: Jose Davies  DOB: 05-13-2000  MRN: [id number]  PCP: [facility]    Patient Active Problem List   Diagnosis Date Noted  . Onychomycosis of toenail 09/06/2019  . High risk medication use 09/06/2019  . History of syphilis 03/10/2017  . Healthcare maintenance 02/02/2017  . HIV (human immunodeficiency virus infection) (HCC) 01/30/2017  . ADHD 01/30/2017   SUBJECTIVE: Chief Complaint  Patient presents with  . Follow-up    b20      HPI Dayten has no concerns today. 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 Has not been the best about taking his pills of late. Still does in general good but there was 1 week he was out of town and missed, spotty misses as well throughout the month. Requesting STI screening for oropharyngeal and urine tests + syphilis. No symptoms or known exposures.   Lost some weight with effort to change lifestyle-- has incorporated more exercise    Review of Systems  Constitutional: Negative for chills and fever.  HENT: Negative for sore throat.   Eyes: Negative for visual disturbance.  Gastrointestinal: Negative for abdominal pain, anal bleeding and rectal pain.  Genitourinary: Negative for dysuria, genital sores, penile discharge, penile pain, scrotal swelling and testicular pain.  Musculoskeletal: Negative for arthralgias and joint swelling.  Skin: Negative for rash.  Neurological: Negative for headaches.  Hematological: Negative for adenopathy.     Past Medical History:  Diagnosis Date  . ADHD   . HIV (human immunodeficiency virus infection) (HCC) 01/30/2017   No Known Allergies  Social History   Tobacco Use  . Smoking status: Never Smoker  . Smokeless tobacco: Never Used  Vaping Use  . Vaping Use: Never used  Substance Use Topics  . Alcohol use: Yes    Alcohol/week: 1.0 standard drink    Types: 1 Cans of beer per week    Comment: Weekends  . Drug use: Yes    Frequency: 7.0 times  per week    Types: Marijuana    Social History   Substance and Sexual Activity  Sexual Activity Not Currently  . Partners: Male  . Birth control/protection: None   Comment: declined condoms    Physical Exam and Objective Findings:  Vitals:   06/07/20 1133  BP: 124/83  Pulse: 67  Resp: 16  SpO2: 98%  Weight: 199 lb 12.8 oz (90.6 kg)  Height: 5\' 9"  (1.753 m)   Body mass index is 29.51 kg/m.   Physical Exam Vitals reviewed.  HENT:     Mouth/Throat:     Mouth: No oral lesions.     Dentition: Normal dentition. No dental caries.  Eyes:     General: No scleral icterus. Cardiovascular:     Rate and Rhythm: Normal rate and regular rhythm.     Heart sounds: Normal heart sounds.  Pulmonary:     Effort: Pulmonary effort is normal.     Breath sounds: Normal breath sounds.  Abdominal:     General: There is no distension.     Palpations: Abdomen is soft.     Tenderness: There is no abdominal tenderness.  Lymphadenopathy:     Cervical: No cervical adenopathy.  Skin:    General: Skin is warm and dry.     Findings: No rash.  Neurological:     Mental Status: He is alert and oriented to person, place, and time.     Lab Results Lab Results  Component  Value Date   WBC 5.4 12/07/2019   HGB 14.9 12/07/2019   HCT 45.0 12/07/2019   MCV 86.5 12/07/2019   PLT 220 12/07/2019    Lab Results  Component Value Date   CREATININE 1.05 12/07/2019   BUN 17 12/07/2019   NA 139 12/07/2019   K 4.4 12/07/2019   CL 106 12/07/2019   CO2 28 12/07/2019    Lab Results  Component Value Date   ALT 13 12/07/2019   AST 17 12/07/2019   ALKPHOS 87 01/03/2017   BILITOT 0.4 12/07/2019    Lab Results  Component Value Date   CHOL 183 (H) 12/07/2019   HDL 65 12/07/2019   LDLCALC 104 12/07/2019   TRIG 53 12/07/2019   CHOLHDL 2.8 12/07/2019   HIV 1 RNA Quant (Copies/mL)  Date Value  12/07/2019 <20  10/07/2019 <20  09/06/2019 <20   CD4 T Cell Abs (/uL)  Date Value  12/07/2019 687   09/06/2019 643  04/07/2019 655   Lab Results  Component Value Date   HAV REACTIVE (A) 01/30/2017   Lab Results  Component Value Date   HEPBSAG NON-REACTIVE 01/30/2017   HEPBSAB NON-REACTIVE 01/30/2017    Assessment & Plan:  Problem List Items Addressed This Visit      Unprioritized   Onychomycosis of toenail   Relevant Medications   ketoconazole (NIZORAL) 2 % cream   bictegravir-emtricitabine-tenofovir AF (BIKTARVY) 50-200-25 MG TABS tablet   HIV (human immunodeficiency virus infection) (HCC) (Chronic)    Doing well with Biktarvy without any concerns over side effects. Counseled re: adherence and concern to reduce risk for viral mutations that would impact his treatment options in the future. Will update pertinent labs today including STI screening.  Refills for biktarvy provided - he will need ADAP bridging at 20 yo when he loses his current medicaid. We also spent time discussing a plan for obtaining and staying on medication while out of town with his work.   Return in about 4 months (around 10/08/2020).       Relevant Medications   ketoconazole (NIZORAL) 2 % cream   bictegravir-emtricitabine-tenofovir AF (BIKTARVY) 50-200-25 MG TABS tablet   Other Relevant Orders   HIV-1 RNA quant-no reflex-bld   T-helper cell (CD4)- ([facility] clinic only)   History of syphilis    Screen today       Healthcare maintenance    Discussed COVID booster - he will consider at upcoming appt. Has had 2 vaccines and recovered from natural infection in January 2022.        Other Visit Diagnoses    Routine screening for STI (sexually transmitted infection)    -  Primary   Relevant Orders   RPR   Cytology (oral, anal, urethral) ancillary only   Urine cytology ancillary only     [name], MSN, NP-C [facility] Pager: [phone]  06/07/2020  2:38 PM

## 2020-06-07 NOTE — Assessment & Plan Note (Signed)
Screen today 

## 2020-06-08 LAB — CYTOLOGY, (ORAL, ANAL, URETHRAL) ANCILLARY ONLY
Chlamydia: NEGATIVE
Comment: NEGATIVE
Comment: NORMAL
Neisseria Gonorrhea: NEGATIVE

## 2020-06-08 LAB — T-HELPER CELL (CD4) - (RCID CLINIC ONLY)
CD4 % Helper T Cell: 42 % (ref 33–65)
CD4 T Cell Abs: 713 /uL (ref 400–1790)

## 2020-06-08 LAB — URINE CYTOLOGY ANCILLARY ONLY
Chlamydia: NEGATIVE
Comment: NEGATIVE
Comment: NORMAL
Neisseria Gonorrhea: NEGATIVE

## 2020-06-10 LAB — HIV-1 RNA QUANT-NO REFLEX-BLD
HIV 1 RNA Quant: NOT DETECTED Copies/mL
HIV-1 RNA Quant, Log: NOT DETECTED Log cps/mL

## 2020-06-10 LAB — RPR: RPR Ser Ql: NONREACTIVE

## 2020-06-11 ENCOUNTER — Ambulatory Visit: Payer: Medicaid Other | Admitting: Infectious Diseases

## 2020-08-28 ENCOUNTER — Encounter: Payer: Self-pay | Admitting: Infectious Diseases

## 2020-08-28 ENCOUNTER — Other Ambulatory Visit (HOSPITAL_COMMUNITY): Payer: Self-pay

## 2020-09-24 ENCOUNTER — Other Ambulatory Visit: Payer: Self-pay

## 2020-09-24 ENCOUNTER — Other Ambulatory Visit: Payer: Medicaid Other

## 2020-09-24 ENCOUNTER — Other Ambulatory Visit (HOSPITAL_COMMUNITY)
Admission: RE | Admit: 2020-09-24 | Discharge: 2020-09-24 | Disposition: A | Discharge status: Home / Self Care | Payer: Medicaid Other | Source: Ambulatory Visit | Attending: Infectious Diseases | Admitting: Infectious Diseases

## 2020-09-24 DIAGNOSIS — B20 Human immunodeficiency virus [HIV] disease: Secondary | ICD-10-CM

## 2020-09-24 DIAGNOSIS — Z113 Encounter for screening for infections with a predominantly sexual mode of transmission: Secondary | ICD-10-CM

## 2020-09-25 LAB — T-HELPER CELL (CD4) - (RCID CLINIC ONLY)
CD4 % Helper T Cell: 43 % (ref 33–65)
CD4 T Cell Abs: 543 /uL (ref 400–1790)

## 2020-09-25 LAB — URINE CYTOLOGY ANCILLARY ONLY
Chlamydia: NEGATIVE
Comment: NEGATIVE
Comment: NORMAL
Neisseria Gonorrhea: NEGATIVE

## 2020-09-26 LAB — COMPLETE METABOLIC PANEL WITH GFR
AG Ratio: 2 (calc) (ref 1.0–2.5)
ALT: 11 U/L (ref 9–46)
AST: 18 U/L (ref 10–40)
Albumin: 4.6 g/dL (ref 3.6–5.1)
Alkaline phosphatase (APISO): 36 U/L (ref 36–130)
BUN/Creatinine Ratio: 14 (calc) (ref 6–22)
BUN: 17 mg/dL (ref 7–25)
CO2: 28 mmol/L (ref 20–32)
Calcium: 9.5 mg/dL (ref 8.6–10.3)
Chloride: 104 mmol/L (ref 98–110)
Creat: 1.25 mg/dL — ABNORMAL HIGH (ref 0.60–1.24)
Globulin: 2.3 g/dL (calc) (ref 1.9–3.7)
Glucose, Bld: 81 mg/dL (ref 65–99)
Potassium: 4.5 mmol/L (ref 3.5–5.3)
Sodium: 138 mmol/L (ref 135–146)
Total Bilirubin: 0.4 mg/dL (ref 0.2–1.2)
Total Protein: 6.9 g/dL (ref 6.1–8.1)
eGFR: 85 mL/min/{1.73_m2} (ref >=60)

## 2020-09-26 LAB — CBC WITH DIFFERENTIAL/PLATELET
Absolute Monocytes: 202 cells/uL (ref 200–950)
Basophils Absolute: 20 cells/uL (ref 0–200)
Basophils Relative: 0.7 %
Eosinophils Absolute: 81 cells/uL (ref 15–500)
Eosinophils Relative: 2.9 %
HCT: 46 % (ref 38.5–50.0)
Hemoglobin: 15.1 g/dL (ref 13.2–17.1)
Lymphs Abs: 1386 cells/uL (ref 850–3900)
MCH: 28.9 pg (ref 27.0–33.0)
MCHC: 32.8 g/dL (ref 32.0–36.0)
MCV: 88.1 fL (ref 80.0–100.0)
MPV: 11.4 fL (ref 7.5–12.5)
Monocytes Relative: 7.2 %
Neutro Abs: 1112 cells/uL — ABNORMAL LOW (ref 1500–7800)
Neutrophils Relative %: 39.7 %
Platelets: 210 10*3/uL (ref 140–400)
RBC: 5.22 10*6/uL (ref 4.20–5.80)
RDW: 12.4 % (ref 11.0–15.0)
Total Lymphocyte: 49.5 %
WBC: 2.8 10*3/uL — ABNORMAL LOW (ref 3.8–10.8)

## 2020-09-26 LAB — RPR: RPR Ser Ql: NONREACTIVE

## 2020-09-26 LAB — HIV-1 RNA QUANT-NO REFLEX-BLD
HIV 1 RNA Quant: NOT DETECTED Copies/mL
HIV-1 RNA Quant, Log: NOT DETECTED Log cps/mL

## 2020-10-03 ENCOUNTER — Ambulatory Visit: Payer: Medicaid Other | Admitting: Infectious Diseases

## 2020-10-30 ENCOUNTER — Other Ambulatory Visit: Payer: Self-pay

## 2020-10-30 ENCOUNTER — Encounter: Payer: Self-pay | Admitting: Infectious Diseases

## 2020-10-30 ENCOUNTER — Ambulatory Visit (INDEPENDENT_AMBULATORY_CARE_PROVIDER_SITE_OTHER): Payer: Medicaid Other | Admitting: Infectious Diseases

## 2020-10-30 VITALS — BP 112/66 | HR 63 | Temp 98.6°F | Wt 212.0 lb

## 2020-10-30 DIAGNOSIS — Z23 Encounter for immunization: Secondary | ICD-10-CM

## 2020-10-30 DIAGNOSIS — Z21 Asymptomatic human immunodeficiency virus [HIV] infection status: Secondary | ICD-10-CM

## 2020-10-30 DIAGNOSIS — Z8619 Personal history of other infectious and parasitic diseases: Secondary | ICD-10-CM | POA: Diagnosis not present

## 2020-10-30 DIAGNOSIS — N4889 Other specified disorders of penis: Secondary | ICD-10-CM | POA: Insufficient documentation

## 2020-10-30 NOTE — Patient Instructions (Signed)
Journal your symptoms - when the pain happens, what you are doing, how you feel, describe the pain in your words, clothing you have on.   I can send in a referral to urology if you would like to be evaluated for this

## 2020-10-30 NOTE — Progress Notes (Signed)
Name: Jose Davies  DOB: 2000/05/27  MRN: [id number]  PCP: [facility]     SUBJECTIVE: Chief Complaint  Patient presents with   Follow-up    B20 - pt reports having intermittent penile pain. Pt denies burning when urinating.       HPI: Doing well on Biktarvy once daily without any missed doses or concern for side effects. 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  No new sexual partners since our last OV.   Subacute onset penile pain - this is kind of sporadic and has hurt intermittently over a few months. The pain is on the testicles that goes up into the stomach/lower abdomen. Described to be a sharp pain that causes some challenges walking. No fevers, swollen lymph nodes. No pain or lumps in the testicles. NO changes in appearance or size of testicles. No ulcers or rashes.    Review of Systems  Constitutional:  Negative for chills and fever.  HENT:  Negative for sore throat.   Eyes:  Negative for visual disturbance.  Gastrointestinal:  Negative for abdominal pain, anal bleeding and rectal pain.  Genitourinary:  Positive for penile pain. Negative for dysuria, genital sores, penile discharge, scrotal swelling and testicular pain.  Musculoskeletal:  Negative for arthralgias and joint swelling.  Skin:  Negative for rash.  Neurological:  Negative for headaches.  Hematological:  Negative for adenopathy.    Past Medical History:  Diagnosis Date   ADHD    HIV (human immunodeficiency virus infection) (HCC) 01/30/2017   No Known Allergies  Social History   Tobacco Use   Smoking status: Every Day    Types: E-cigarettes   Smokeless tobacco: Never  Vaping Use   Vaping Use: Never used  Substance Use Topics   Alcohol use: Yes    Alcohol/week: 1.0 standard drink    Types: 1 Cans of beer per week    Comment: Weekends   Drug use: Yes    Frequency: 7.0 times per week    Types: Marijuana     Social History   Substance and Sexual Activity  Sexual Activity Not Currently   Partners: Male   Birth control/protection: None   Comment: declined condoms    Physical Exam and Objective Findings:  Vitals:   10/30/20 1110  BP: 112/66  Pulse: 63  Temp: 98.6 F (37 C)  TempSrc: Oral  SpO2: 97%  Weight: 212 lb (96.2 kg)   Body mass index is 31.31 kg/m.   Physical Exam Vitals reviewed. Exam conducted with a chaperone present.  HENT:     Mouth/Throat:     Mouth: No oral lesions.     Dentition: Normal dentition. No dental caries.  Eyes:     General: No scleral icterus. Cardiovascular:     Rate and Rhythm: Normal rate and regular rhythm.     Heart sounds: Normal heart sounds.  Pulmonary:     Effort: Pulmonary effort is normal.     Breath sounds: Normal breath sounds.  Abdominal:     General: There is no distension.     Palpations: Abdomen is soft.     Tenderness: There is no abdominal tenderness.     Hernia: There is no hernia in the right inguinal area.  Genitourinary:    Pubic Area: No rash.      Penis: Normal.      Testes: Normal.     Epididymis:  Right: Normal.     Left: Normal.  Lymphadenopathy:     Cervical: No cervical adenopathy.     Lower Body: No right inguinal adenopathy.  Skin:    General: Skin is warm and dry.     Findings: No rash.  Neurological:     Mental Status: He is alert and oriented to person, place, and time.    Lab Results Lab Results  Component Value Date   WBC 2.8 (L) 09/24/2020   HGB 15.1 09/24/2020   HCT 46.0 09/24/2020   MCV 88.1 09/24/2020   PLT 210 09/24/2020    Lab Results  Component Value Date   CREATININE 1.25 (H) 09/24/2020   BUN 17 09/24/2020   NA 138 09/24/2020   K 4.5 09/24/2020   CL 104 09/24/2020   CO2 28 09/24/2020    Lab Results  Component Value Date   ALT 11 09/24/2020   AST 18 09/24/2020   ALKPHOS 87 01/03/2017   BILITOT 0.4 09/24/2020    Lab Results  Component Value Date   CHOL 183 (H)  12/07/2019   HDL 65 12/07/2019   LDLCALC 104 12/07/2019   TRIG 53 12/07/2019   CHOLHDL 2.8 12/07/2019   HIV 1 RNA Quant (Copies/mL)  Date Value  09/24/2020 Not Detected  06/07/2020 Not Detected  12/07/2019 <20   CD4 T Cell Abs (/uL)  Date Value  09/24/2020 543  06/07/2020 713  12/07/2019 687   Lab Results  Component Value Date   HAV REACTIVE (A) 01/30/2017   Lab Results  Component Value Date   HEPBSAG NON-REACTIVE 01/30/2017   HEPBSAB NON-REACTIVE 01/30/2017    Assessment & Plan:  Problem List Items Addressed This Visit       Unprioritized   HIV (human immunodeficiency virus infection) (HCC) (Chronic)    Well controlled on biktarvy once daily. Had labs updated recently in august with undetectable VL and healthy CD4.  STi screen negative.   Will turn 20 in November - advised to call our clinic at that time so we can help him with ADAP application.   Return in about 4 months (around 03/01/2021).       Penile pain    Intermittent and subacute. Hard to find a pattern with his symptoms and what may trigger. No pain with erection/ejaculation. No blood in sperm. Clinical exam without any abnormalities and negative for hernia.  I don't feel we need to repeat STI screening and he agrees.  Will see if he can keep a journal of symptoms and help better understand these pains. Would consider referral to urology if persistently an issue for him.       History of syphilis    RPR non reactive 53m ago and no sexual contacts over the last 67m.       Other Visit Diagnoses     Need for immunization against influenza    -  Primary   Relevant Orders   Flu Vaccine QUAD 61mo+IM (Fluarix, Fluzone & Alfiuria Quad PF) (Completed)      [name], MSN, NP-C [facility] Pager: [phone]  10/30/2020  3:36 PM

## 2020-10-30 NOTE — Assessment & Plan Note (Signed)
RPR non reactive 26m ago and no sexual contacts over the last 35m.

## 2020-10-30 NOTE — Assessment & Plan Note (Signed)
Intermittent and subacute. Hard to find a pattern with his symptoms and what may trigger. No pain with erection/ejaculation. No blood in sperm. Clinical exam without any abnormalities and negative for hernia.  I don't feel we need to repeat STI screening and he agrees.  Will see if he can keep a journal of symptoms and help better understand these pains. Would consider referral to urology if persistently an issue for him.

## 2020-10-30 NOTE — Assessment & Plan Note (Addendum)
Well controlled on biktarvy once daily. Had labs updated recently in august with undetectable VL and healthy CD4.  STi screen negative.   Will turn 20 in November - advised to call our clinic at that time so we can help him with ADAP application.   Return in about 4 months (around 03/01/2021).

## 2020-11-19 ENCOUNTER — Other Ambulatory Visit: Payer: Self-pay

## 2020-11-19 ENCOUNTER — Ambulatory Visit: Payer: Medicaid Other

## 2020-11-30 ENCOUNTER — Other Ambulatory Visit: Payer: Self-pay | Admitting: Infectious Diseases

## 2020-11-30 DIAGNOSIS — Z21 Asymptomatic human immunodeficiency virus [HIV] infection status: Secondary | ICD-10-CM

## 2020-12-19 ENCOUNTER — Encounter: Payer: Self-pay | Admitting: Infectious Diseases

## 2021-02-20 ENCOUNTER — Ambulatory Visit: Payer: Medicaid Other | Admitting: Infectious Diseases

## 2021-03-20 ENCOUNTER — Other Ambulatory Visit: Payer: Self-pay | Admitting: Infectious Diseases

## 2021-03-20 DIAGNOSIS — Z21 Asymptomatic human immunodeficiency virus [HIV] infection status: Secondary | ICD-10-CM

## 2021-05-05 ENCOUNTER — Other Ambulatory Visit: Payer: Self-pay | Admitting: Infectious Diseases

## 2021-05-05 DIAGNOSIS — Z21 Asymptomatic human immunodeficiency virus [HIV] infection status: Secondary | ICD-10-CM

## 2021-07-31 ENCOUNTER — Other Ambulatory Visit: Payer: Self-pay

## 2021-07-31 ENCOUNTER — Encounter: Payer: Self-pay | Admitting: Infectious Diseases

## 2021-07-31 ENCOUNTER — Ambulatory Visit (INDEPENDENT_AMBULATORY_CARE_PROVIDER_SITE_OTHER): Payer: Medicaid Other | Admitting: Infectious Diseases

## 2021-07-31 VITALS — BP 120/73 | HR 53 | Temp 97.4°F | Wt 233.0 lb

## 2021-07-31 DIAGNOSIS — Z21 Asymptomatic human immunodeficiency virus [HIV] infection status: Secondary | ICD-10-CM

## 2021-07-31 DIAGNOSIS — J029 Acute pharyngitis, unspecified: Secondary | ICD-10-CM | POA: Insufficient documentation

## 2021-07-31 DIAGNOSIS — E669 Obesity, unspecified: Secondary | ICD-10-CM | POA: Diagnosis not present

## 2021-07-31 DIAGNOSIS — E66811 Obesity, class 1: Secondary | ICD-10-CM | POA: Insufficient documentation

## 2021-07-31 DIAGNOSIS — F5101 Primary insomnia: Secondary | ICD-10-CM | POA: Diagnosis not present

## 2021-07-31 DIAGNOSIS — Z8619 Personal history of other infectious and parasitic diseases: Secondary | ICD-10-CM | POA: Diagnosis not present

## 2021-07-31 DIAGNOSIS — R5383 Other fatigue: Secondary | ICD-10-CM | POA: Diagnosis not present

## 2021-07-31 MED ORDER — BIKTARVY 50-200-25 MG PO TABS: ORAL_TABLET | ORAL | 11 refills | Status: DC

## 2021-07-31 MED ORDER — TRAZODONE HCL 50 MG PO TABS: 50.0000 mg | ORAL_TABLET | Freq: Every day | ORAL | 0 refills | Status: DC

## 2021-07-31 NOTE — Assessment & Plan Note (Signed)
Sleep pattern is pretty off and inconsistent. Not quality sleep. Not surprised he is fatigued and suspect this is primary driver.   Recommendations: -Sleep hygiene discussed.  -Trazodone 50-100 mg as needed at night for sleep - OK to refill if this works for him

## 2021-07-31 NOTE — Patient Instructions (Addendum)
  Please call [facility] @ [phone] to schedule a dental appointment   Dehumidifier may be something to help.   Vitamin D 2,000 units once a day in a capsule.   Can consider trying a over the counter antihistamine once a day to see if that helps with the sore throat.   Trazodone is the sleep pill I want you to try - if you like it request a refill from your pharmacy. Can do 1-2 tabs at night as needed.   Recommendations for improving sleep:  Avoid having pets sleep in the bedroom Avoid caffeine consumption after 4pm Keep bedroom cool and conducive to sleep Avoid nicotine use, especially in the evening Avoid exercise within 2-3 hours before bedtime  Stimulus Control:  Go to bed only when sleepy Use the bedroom for sleep and sex only Go to another room if you are unable to fall asleep within 15 to 20 minutes Read or engage in other quiet activities and return to bed only when sleepy.   The P:E Diet is a good book to consider using.  Resistance bands for portable exercise. Walking program.    Referrals Placed:  ENT (throat surgery team) [facility]

## 2021-07-31 NOTE — Assessment & Plan Note (Signed)
Very well controlled on once daily Biktarvy. No concerns with access or adherence to medication. They are tolerating the medication well without side effects. No drug interactions identified.  No insurance changes at this time - medicaid still active for now.  No dental needs today.  No concern over anxious/depressed mood.  Sexual health discussed including screening needs. Politely declined. Did not get to updating vaccines today - will be due for [facility] booster at next OV, Prevnar in 2026. Need to re-assess Hepatitis B immunity following vaccination.    Return in about 6 months (around 01/30/2022).

## 2021-07-31 NOTE — Assessment & Plan Note (Signed)
Screening RPR today

## 2021-07-31 NOTE — Assessment & Plan Note (Signed)
Body mass index is 34.41 kg/m.   Will not meet requirement for any surgical intervention here. I do think he would have some good success with medical management however. Will refer to medical weight management clinic. He is having some success with adding in exercise. 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 Recommended he read The P:E Diet for good reference.

## 2021-07-31 NOTE — Assessment & Plan Note (Signed)
Frequent and cyclical sore throats that he describes to be severe with swelling of tonsils. Today exam is normal with 2+ tonsils bilaterally, no exudate or erythema.   Recommendations: -ENT referral place per patient request  -Trial of daily antihistamine -?mold / environmental triggers. Suggested dehumidifier in cabins  -topical vapo cool

## 2021-07-31 NOTE — Progress Notes (Signed)
Name: Jose Davies  DOB: Feb 03, 2001  MRN: [id number]  PCP: [facility]     SUBJECTIVE: Chief Complaint  Patient presents with   Follow-up    HIV follow up care. 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        HPI: Doing well on Biktarvy once daily without any missed doses or concern for side effects. Has been having his family send him his medication while out at work.   He has a few concerns today: Has had monthly episodes of upper respiratory symptoms - usually gets some sore throat, runny nose. 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 He describes frequent severe sore throats that cause a fair amount of pain where he feels like his tonsils get very large.  Takes zinc, magnesium, vitamin c gummies everyday. No coughing but does note some subjective fevers/sweating. Wondering if he needs to have his tonsils out.   Low energy - sleep is not as interrupted as before but still not consistent. Melatonin only works intermittently for him. Now that he is home his sleep is a little better but still quite interrupted. Gets only a few chunks of hours in at a time. Ultimately would like to be up and moving before 11:00.   Weight gain - needs some help with strategies for weight reduction. Has been very interested in lap band surgery but not sure he qualifies for that.  Since being home he has had some subjective weight loss with adapting to a less stressful schedule and resuming the gym. He is interested in mediation assistance for this problem.    Review of Systems  Constitutional:  Positive for unexpected weight change (weight gain). Negative for chills and fever.  HENT:  Positive for rhinorrhea, sore throat and trouble swallowing. Negative for congestion, dental problem, drooling, facial swelling, nosebleeds, postnasal drip, sinus pressure, sinus pain, sneezing and voice change.   Eyes:  Negative for  visual disturbance.  Respiratory:  Negative for cough, shortness of breath and wheezing.   Cardiovascular:  Negative for chest pain.  Gastrointestinal:  Negative for abdominal pain, anal bleeding and rectal pain.  Genitourinary:  Negative for dysuria, genital sores, penile discharge, penile pain, scrotal swelling and testicular pain.  Musculoskeletal:  Negative for arthralgias and joint swelling.  Skin:  Negative for rash.  Neurological:  Negative for headaches.  Hematological:  Negative for adenopathy.  Psychiatric/Behavioral:  Negative for dysphoric mood. The patient is not nervous/anxious.      Past Medical History:  Diagnosis Date   ADHD    HIV (human immunodeficiency virus infection) (HCC) 01/30/2017   No Known Allergies  Social History   Tobacco Use   Smoking status: Every Day    Types: E-cigarettes   Smokeless tobacco: Never  Vaping Use   Vaping Use: Never used  Substance Use Topics   Alcohol use: Yes    Alcohol/week: 1.0 standard drink of alcohol    Types: 1 Cans of beer per week    Comment: Weekends   Drug use: Yes    Frequency: 7.0 times per week    Types: Marijuana    Social History   Substance and Sexual Activity  Sexual Activity Not Currently   Partners: Male   Birth control/protection: None   Comment: declined condoms    Physical Exam and Objective Findings:  Vitals:   07/31/21 1347  BP: 120/73  Pulse: (!) 53  Temp: (!)  97.4 F (36.3 C)  TempSrc: Temporal  Weight: 233 lb (105.7 kg)   Body mass index is 34.41 kg/m.   Physical Exam Vitals reviewed. Exam conducted with a chaperone present.  HENT:     Mouth/Throat:     Mouth: No oral lesions.     Dentition: Normal dentition. No dental caries.  Eyes:     General: No scleral icterus. Cardiovascular:     Rate and Rhythm: Normal rate and regular rhythm.     Heart sounds: Normal heart sounds.  Pulmonary:     Effort: Pulmonary effort is normal.     Breath sounds: Normal breath sounds.   Abdominal:     General: There is no distension.     Palpations: Abdomen is soft.     Tenderness: There is no abdominal tenderness.     Hernia: There is no hernia in the right inguinal area.  Genitourinary:    Pubic Area: No rash.      Penis: Normal.      Testes: Normal.     Epididymis:     Right: Normal.     Left: Normal.  Lymphadenopathy:     Cervical: No cervical adenopathy.     Lower Body: No right inguinal adenopathy.  Skin:    General: Skin is warm and dry.     Findings: No rash.  Neurological:     Mental Status: He is alert and oriented to person, place, and time.     Lab Results Lab Results  Component Value Date   WBC 2.8 (L) 09/24/2020   HGB 15.1 09/24/2020   HCT 46.0 09/24/2020   MCV 88.1 09/24/2020   PLT 210 09/24/2020    Lab Results  Component Value Date   CREATININE 1.25 (H) 09/24/2020   BUN 17 09/24/2020   NA 138 09/24/2020   K 4.5 09/24/2020   CL 104 09/24/2020   CO2 28 09/24/2020    Lab Results  Component Value Date   ALT 11 09/24/2020   AST 18 09/24/2020   ALKPHOS 87 01/03/2017   BILITOT 0.4 09/24/2020    Lab Results  Component Value Date   CHOL 183 (H) 12/07/2019   HDL 65 12/07/2019   LDLCALC 104 12/07/2019   TRIG 53 12/07/2019   CHOLHDL 2.8 12/07/2019   HIV 1 RNA Quant (Copies/mL)  Date Value  09/24/2020 Not Detected  06/07/2020 Not Detected  12/07/2019 <20   CD4 T Cell Abs (/uL)  Date Value  09/24/2020 543  06/07/2020 713  12/07/2019 687   Lab Results  Component Value Date   HAV REACTIVE (A) 01/30/2017   Lab Results  Component Value Date   HEPBSAG NON-REACTIVE 01/30/2017   HEPBSAB NON-REACTIVE 01/30/2017    Assessment & Plan:  Problem List Items Addressed This Visit       Unprioritized   HIV (human immunodeficiency virus infection) (HCC) - Primary (Chronic)    Very well controlled on once daily Biktarvy. No concerns with access or adherence to medication. They are tolerating the medication well without side  effects. No drug interactions identified.  No insurance changes at this time - medicaid still active for now.  No dental needs today.  No concern over anxious/depressed mood.  Sexual health discussed including screening needs. Politely declined. Did not get to updating vaccines today - will be due for [facility] booster at next OV, Prevnar in 2026. Need to re-assess Hepatitis B immunity following vaccination.    Return in about 6 months (around 01/30/2022).  Relevant Medications   bictegravir-emtricitabine-tenofovir AF (BIKTARVY) 50-200-25 MG TABS tablet   Other Relevant Orders   HIV-1 RNA quant-no reflex-bld   COMPLETE METABOLIC PANEL WITH GFR   CBC with Differential/Platelet   RPR   History of syphilis    Screening RPR today       Obesity (BMI 30.0-34.9)    Body mass index is 34.41 kg/m.   Will not meet requirement for any surgical intervention here. I do think he would have some good success with medical management however. Will refer to medical weight management clinic. He is having some success with adding in exercise. 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 Recommended he read The P:E Diet for good reference.       Relevant Orders   Amb Ref to Medical Weight Management   Primary insomnia    Sleep pattern is pretty off and inconsistent. Not quality sleep. Not surprised he is fatigued and suspect this is primary driver.   Recommendations: -Sleep hygiene discussed.  -Trazodone 50-100 mg as needed at night for sleep - OK to refill if this works for him        Relevant Medications   traZODone (DESYREL) 50 MG tablet   Sore throat    Frequent and cyclical sore throats that he describes to be severe with swelling of tonsils. Today exam is normal with 2+ tonsils bilaterally, no exudate or erythema.   Recommendations: -ENT referral place per patient request  -Trial of daily antihistamine -?mold /  environmental triggers. Suggested dehumidifier in cabins  -topical vapo cool       Relevant Orders   Ambulatory referral to ENT   Other Visit Diagnoses     Other fatigue       Relevant Orders   Vitamin D (25 hydroxy)       [name], MSN, NP-C [facility] Pager: [phone]  07/31/2021  3:49 PM

## 2021-08-03 LAB — CBC WITH DIFFERENTIAL/PLATELET
Absolute Monocytes: 282 cells/uL (ref 200–950)
Basophils Absolute: 9 cells/uL (ref 0–200)
Basophils Relative: 0.3 %
Eosinophils Absolute: 69 cells/uL (ref 15–500)
Eosinophils Relative: 2.3 %
HCT: 44.1 % (ref 38.5–50.0)
Hemoglobin: 14.7 g/dL (ref 13.2–17.1)
Lymphs Abs: 1224 cells/uL (ref 850–3900)
MCH: 29 pg (ref 27.0–33.0)
MCHC: 33.3 g/dL (ref 32.0–36.0)
MCV: 87 fL (ref 80.0–100.0)
MPV: 11.3 fL (ref 7.5–12.5)
Monocytes Relative: 9.4 %
Neutro Abs: 1416 cells/uL — ABNORMAL LOW (ref 1500–7800)
Neutrophils Relative %: 47.2 %
Platelets: 207 10*3/uL (ref 140–400)
RBC: 5.07 10*6/uL (ref 4.20–5.80)
RDW: 12.6 % (ref 11.0–15.0)
Total Lymphocyte: 40.8 %
WBC: 3 10*3/uL — ABNORMAL LOW (ref 3.8–10.8)

## 2021-08-03 LAB — COMPLETE METABOLIC PANEL WITH GFR
AG Ratio: 1.9 (calc) (ref 1.0–2.5)
ALT: 18 U/L (ref 9–46)
AST: 21 U/L (ref 10–40)
Albumin: 4.6 g/dL (ref 3.6–5.1)
Alkaline phosphatase (APISO): 38 U/L (ref 36–130)
BUN: 16 mg/dL (ref 7–25)
CO2: 27 mmol/L (ref 20–32)
Calcium: 9.5 mg/dL (ref 8.6–10.3)
Chloride: 104 mmol/L (ref 98–110)
Creat: 1.17 mg/dL (ref 0.60–1.24)
Globulin: 2.4 g/dL (calc) (ref 1.9–3.7)
Glucose, Bld: 89 mg/dL (ref 65–99)
Potassium: 4.6 mmol/L (ref 3.5–5.3)
Sodium: 137 mmol/L (ref 135–146)
Total Bilirubin: 0.6 mg/dL (ref 0.2–1.2)
Total Protein: 7 g/dL (ref 6.1–8.1)
eGFR: 91 mL/min/{1.73_m2} (ref >=60)

## 2021-08-03 LAB — VITAMIN D 25 HYDROXY (VIT D DEFICIENCY, FRACTURES): Vit D, 25-Hydroxy: 21 ng/mL — ABNORMAL LOW (ref 30–100)

## 2021-08-03 LAB — HIV-1 RNA QUANT-NO REFLEX-BLD
HIV 1 RNA Quant: NOT DETECTED Copies/mL
HIV-1 RNA Quant, Log: NOT DETECTED Log cps/mL

## 2021-08-03 LAB — RPR: RPR Ser Ql: NONREACTIVE

## 2021-08-28 ENCOUNTER — Other Ambulatory Visit: Payer: Self-pay

## 2021-08-28 DIAGNOSIS — F5101 Primary insomnia: Secondary | ICD-10-CM

## 2021-08-28 MED ORDER — TRAZODONE HCL 50 MG PO TABS: 50.0000 mg | ORAL_TABLET | Freq: Every day | ORAL | 2 refills | Status: DC

## 2021-09-19 ENCOUNTER — Ambulatory Visit: Payer: Medicaid Other | Admitting: Infectious Diseases

## 2021-09-20 ENCOUNTER — Encounter: Payer: Self-pay | Admitting: Infectious Diseases

## 2021-09-20 ENCOUNTER — Other Ambulatory Visit: Payer: Self-pay

## 2021-09-20 ENCOUNTER — Ambulatory Visit (INDEPENDENT_AMBULATORY_CARE_PROVIDER_SITE_OTHER): Payer: Medicaid Other | Admitting: Infectious Diseases

## 2021-09-20 DIAGNOSIS — L708 Other acne: Secondary | ICD-10-CM

## 2021-09-20 MED ORDER — MUPIROCIN 2 % EX OINT: 1.0000 | TOPICAL_OINTMENT | Freq: Two times a day (BID) | CUTANEOUS | 0 refills | Status: DC

## 2021-09-20 NOTE — Progress Notes (Signed)
   Subjective:    Patient ID: Jose Davies, male    DOB: [birth year], 21 y.o.   MRN: [id number]  CC:  Rash on buttocks.    HPI: Jose Davies came in today for evaluation of a waxing and waning rash he has noticed over his buttocks.   Comes and goes on its own. Feels like pimples. Wears mostly thongs, does like tighter fitting clothing. No drainage but does scratch the rash sometimes and it can open up and sting a little. Has been putting tea tree oil on this with some good effect.    No Known Allergies    Outpatient Medications Prior to Visit  Medication Sig Dispense Refill   bictegravir-emtricitabine-tenofovir AF (BIKTARVY) 50-200-25 MG TABS tablet TAKE 1 TABLET BY MOUTH AT THE SAME TIME EVERY DAY WITH OR WITHOUT FOOD 30 tablet 11   traZODone (DESYREL) 50 MG tablet Take 1 tablet (50 mg total) by mouth at bedtime. 30 tablet 2   No facility-administered medications prior to visit.     Past Medical History:  Diagnosis Date   ADHD    HIV (human immunodeficiency virus infection) (HCC) 01/30/2017      No past surgical history on file.    Review of Systems      Objective:    BP 120/79   Pulse (!) 52   Temp 98.1 F (36.7 C) (Oral)   Wt 221 lb (100.2 kg)   BMI 32.64 kg/m  Nursing note and vital signs reviewed.  Physical Exam Skin:    Comments: Red scattered papules and pustules over bilateral buttocks.          Assessment & Plan:   Patient Active Problem List   Diagnosis Date Noted   Inflammatory acne 09/20/2021   Primary insomnia 07/31/2021   Sore throat 07/31/2021   Obesity (BMI 30.0-34.9) 07/31/2021   High risk medication use 09/06/2019   History of syphilis 03/10/2017   Healthcare maintenance 02/02/2017   HIV (human immunodeficiency virus infection) (HCC) 01/30/2017   ADHD 01/30/2017    Problem List Items Addressed This Visit       Unprioritized   Inflammatory acne    Rash is most consistent with superficial inflammatory acne. Recommendations  for benzyl peroxide 10% wash - start 2 or 3 days a week and increase to daily. May cause drying/irritation, peeling around papules is typically what I would expect as it causes increased cellular turnover.  If intolerant of this would recommend trying salicylic acid washes instead.  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          I am having Jose Davies start on mupirocin ointment. I am also having him maintain his Biktarvy and traZODone.   Meds ordered this encounter  Medications   mupirocin ointment (BACTROBAN) 2 %    Sig: Apply 1 Application topically 2 (two) times daily.    Dispense:  30 g    Refill:  0    Substitute smaller tube if needed       Jose Alberts, MSN, NP-C [facility]  [email or web address] Pager: [phone] Office: [phone] [facility] Main Line: [phone]

## 2021-09-20 NOTE — Patient Instructions (Addendum)
Start using a wash on your buttocks with Benzyl Peroxide 10%.  Can cause drying and for some irritation. Start slow 2-3 showers a week then can increase to everyday.    If you cannot tolerate this, find a product with Salcylic Acid - Neutrogena or CeraVe products are great.

## 2021-09-20 NOTE — Assessment & Plan Note (Signed)
Rash is most consistent with superficial inflammatory acne. Recommendations for benzyl peroxide 10% wash - start 2 or 3 days a week and increase to daily. May cause drying/irritation, peeling around papules is typically what I would expect as it causes increased cellular turnover.  If intolerant of this would recommend trying salicylic acid washes instead.  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

## 2022-01-07 ENCOUNTER — Other Ambulatory Visit (HOSPITAL_COMMUNITY): Payer: Self-pay

## 2022-01-09 ENCOUNTER — Other Ambulatory Visit: Payer: Self-pay | Admitting: Pharmacist

## 2022-01-09 DIAGNOSIS — B20 Human immunodeficiency virus [HIV] disease: Secondary | ICD-10-CM

## 2022-01-09 MED ORDER — BIKTARVY 50-200-25 MG PO TABS: 1.0000 | ORAL_TABLET | Freq: Every day | ORAL | 0 refills | Status: AC

## 2022-01-09 NOTE — Progress Notes (Signed)
Medication Samples have been provided to the patient.  Drug name: Biktarvy        Strength: 50/200/25 mg       Qty: 21 tablets (3 bottles)   LOT: [id number]   Exp.Date: 04/2024  Dosing instructions: Take one tablet by mouth once daily  The patient has been instructed regarding the correct time, dose, and frequency of taking this medication, including desired effects and most common side effects.   Kaylen Nghiem L. Domonick Sittner, PharmD, BCIDP, AAHIVP, CPP Clinical Pharmacist Practitioner Infectious Diseases Clinical Pharmacist [facility] 01/16/2020, 10:07 AM  

## 2022-01-13 ENCOUNTER — Other Ambulatory Visit (HOSPITAL_COMMUNITY): Payer: Self-pay

## 2022-01-13 ENCOUNTER — Other Ambulatory Visit: Payer: Self-pay

## 2022-01-22 ENCOUNTER — Ambulatory Visit: Payer: Self-pay | Admitting: Infectious Diseases

## 2022-02-20 ENCOUNTER — Other Ambulatory Visit: Payer: Self-pay

## 2022-02-20 ENCOUNTER — Encounter: Payer: Self-pay | Admitting: Infectious Diseases

## 2022-02-20 ENCOUNTER — Ambulatory Visit (INDEPENDENT_AMBULATORY_CARE_PROVIDER_SITE_OTHER): Payer: Self-pay

## 2022-02-20 ENCOUNTER — Ambulatory Visit (INDEPENDENT_AMBULATORY_CARE_PROVIDER_SITE_OTHER): Payer: Self-pay | Admitting: Infectious Diseases

## 2022-02-20 ENCOUNTER — Ambulatory Visit: Payer: Self-pay | Admitting: Infectious Diseases

## 2022-02-20 VITALS — BP 112/72 | HR 63 | Temp 98.6°F | Resp 16 | Ht 70.0 in | Wt 205.0 lb

## 2022-02-20 DIAGNOSIS — Z0000 Encounter for general adult medical examination without abnormal findings: Secondary | ICD-10-CM

## 2022-02-20 DIAGNOSIS — E669 Obesity, unspecified: Secondary | ICD-10-CM

## 2022-02-20 DIAGNOSIS — Z23 Encounter for immunization: Secondary | ICD-10-CM

## 2022-02-20 DIAGNOSIS — B20 Human immunodeficiency virus [HIV] disease: Secondary | ICD-10-CM

## 2022-02-20 DIAGNOSIS — Z21 Asymptomatic human immunodeficiency virus [HIV] infection status: Secondary | ICD-10-CM

## 2022-02-20 MED ORDER — BUPROPION HCL ER (XL) 150 MG PO TB24: 150.0000 mg | ORAL_TABLET | Freq: Every day | ORAL | 1 refills | Status: DC

## 2022-02-20 NOTE — Assessment & Plan Note (Signed)
He has had some slow success with weight loss. BMI is now 29.41. Offered trial of wellbutrin to see if that helps with appetite reduction. Will give 54m supply. If he wants to continue or increase the dose will have him message me on MyChart portal. If it changes mood negatively or makes him feel flat, encouraged him to stop it.

## 2022-02-20 NOTE — Progress Notes (Signed)
Subjective:    Patient ID: Jose Davies, male    DOB: 03-25-2000, 22 y.o.   MRN: [id number]  Chief Complaint  Patient presents with   Follow-up    B20      HPI:  Jose Davies is here for routine follow up for HIV care. He has been doing well.  Plans for travel coming up soon. 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   Still desires weight loss - works out everyday. Has always had a big appetite. Does not eat much for a few hours when he wakes up. Does not do a lot of cooking at home.   No concerns over mood. No sexual health concerns.    No Known Allergies    Outpatient Medications Prior to Visit  Medication Sig Dispense Refill   bictegravir-emtricitabine-tenofovir AF (BIKTARVY) 50-200-25 MG TABS tablet TAKE 1 TABLET BY MOUTH AT THE SAME TIME EVERY DAY WITH OR WITHOUT FOOD 30 tablet 11   mupirocin ointment (BACTROBAN) 2 % Apply 1 Application topically 2 (two) times daily. 30 g 0   traZODone (DESYREL) 50 MG tablet Take 1 tablet (50 mg total) by mouth at bedtime. 30 tablet 2   No facility-administered medications prior to visit.     Past Medical History:  Diagnosis Date   ADHD    HIV (human immunodeficiency virus infection) ([place]) 01/30/2017      No past surgical history on file.    Review of Systems  Constitutional:  Negative for chills and fever.  HENT:  Negative for sore throat.   Eyes:  Negative for visual disturbance.  Gastrointestinal:  Negative for abdominal pain, anal bleeding and rectal pain.  Genitourinary:  Negative for dysuria, genital sores, penile discharge, penile pain, scrotal swelling and testicular pain.  Musculoskeletal:  Negative for arthralgias and joint swelling.  Skin:  Negative for rash.  Neurological:  Negative for headaches.  Hematological:  Negative for adenopathy.        Objective:    BP 112/72   Pulse 63   Temp 98.6 F (37 C) (Oral)   Resp 16   Ht 5\' 10"  (1.778 m)   Wt 205 lb (93 kg)    SpO2 97%   BMI 29.41 kg/m  Nursing note and vital signs reviewed.  Physical Exam Vitals and nursing note reviewed.  HENT:     Mouth/Throat:     Mouth: No oral lesions.     Dentition: Normal dentition. No dental caries.  Eyes:     General: No scleral icterus. Cardiovascular:     Rate and Rhythm: Normal rate and regular rhythm.     Heart sounds: Normal heart sounds.  Pulmonary:     Effort: Pulmonary effort is normal.     Breath sounds: Normal breath sounds.  Abdominal:     General: There is no distension.     Palpations: Abdomen is soft.     Tenderness: There is no abdominal tenderness.  Lymphadenopathy:     Cervical: No cervical adenopathy.  Skin:    General: Skin is warm and dry.     Findings: No rash.     Comments: Red scattered papules and pustules over bilateral buttocks.   Neurological:     Mental Status: He is alert and oriented to person, place, and time.         Assessment & Plan:   Patient Active Problem List   Diagnosis Date Noted   Inflammatory acne 09/20/2021   Primary  insomnia 07/31/2021   Sore throat 07/31/2021   Obesity (BMI 30.0-34.9) 07/31/2021   High risk medication use 09/06/2019   History of syphilis 03/10/2017   Healthcare maintenance 02/02/2017   HIV (human immunodeficiency virus infection) ([place]) 01/30/2017   ADHD 01/30/2017    Problem List Items Addressed This Visit       Unprioritized   HIV (human immunodeficiency virus infection) ([place]) (Chronic)    Very well controlled on once daily Biktarvy. No concerns with access or adherence to medication. They are tolerating the medication well without side effects. No drug interactions identified. Pertinent lab tests ordered today.  Reminded about ADAP re-enrollment in July / January. Not eligible for Medicaid currently.  No dental needs today.  No concern over anxious/depressed mood.  Sexual health and family planning discussed - no needs today. Offered / declined sexual health screenings.   Vaccines updated today - see health maintenance section.    RTC 19m       Healthcare maintenance    Flu and COVID vaccines today.       Obesity (BMI 30.0-34.9)    He has had some slow success with weight loss. BMI is now 29.41. Offered trial of wellbutrin to see if that helps with appetite reduction. Will give 59m supply. If he wants to continue or increase the dose will have him message me on MyChart portal. If it changes mood negatively or makes him feel flat, encouraged him to stop it.       Other Visit Diagnoses     Human immunodeficiency virus (HIV) disease ([place])    -  Primary   Relevant Orders   HIV 1 RNA quant-no reflex-bld   T-helper cells (CD4) count         Janene Madeira, MSN, NP-C [facility] [email or web address] Pager: [phone] Office: [phone] [place]: [phone]

## 2022-02-20 NOTE — Assessment & Plan Note (Signed)
Flu and COVID vaccines today.

## 2022-02-20 NOTE — Patient Instructions (Addendum)
Please continue the Biktarvy once a day as you have been taking.   Will see you back in July - can do labs and financial meeting the same day for you.   Will try the Wellbutrin once a day to see if this helps with some appetite control for you.

## 2022-02-20 NOTE — Assessment & Plan Note (Signed)
Very well controlled on once daily Biktarvy. No concerns with access or adherence to medication. They are tolerating the medication well without side effects. No drug interactions identified. Pertinent lab tests ordered today.  Reminded about ADAP re-enrollment in July / January. Not eligible for Medicaid currently.  No dental needs today.  No concern over anxious/depressed mood.  Sexual health and family planning discussed - no needs today. Offered / declined sexual health screenings.  Vaccines updated today - see health maintenance section.    RTC 37m

## 2022-02-21 ENCOUNTER — Other Ambulatory Visit: Payer: Self-pay

## 2022-02-21 DIAGNOSIS — Z23 Encounter for immunization: Secondary | ICD-10-CM

## 2022-02-21 LAB — T-HELPER CELLS (CD4) COUNT (NOT AT ARMC)
CD4 % Helper T Cell: 41 % (ref 33–65)
CD4 T Cell Abs: 629 /uL (ref 400–1790)

## 2022-02-25 LAB — HIV-1 RNA QUANT-NO REFLEX-BLD
HIV 1 RNA Quant: NOT DETECTED Copies/mL
HIV-1 RNA Quant, Log: NOT DETECTED Log cps/mL

## 2022-03-25 ENCOUNTER — Other Ambulatory Visit: Payer: Self-pay | Admitting: Infectious Diseases

## 2022-08-18 ENCOUNTER — Other Ambulatory Visit: Payer: Self-pay | Admitting: Infectious Diseases

## 2022-08-18 DIAGNOSIS — Z21 Asymptomatic human immunodeficiency virus [HIV] infection status: Secondary | ICD-10-CM

## 2022-08-28 ENCOUNTER — Ambulatory Visit: Payer: Self-pay

## 2022-08-28 ENCOUNTER — Other Ambulatory Visit: Payer: Self-pay

## 2022-08-28 ENCOUNTER — Encounter: Payer: Self-pay | Admitting: Infectious Diseases

## 2022-08-28 ENCOUNTER — Ambulatory Visit (INDEPENDENT_AMBULATORY_CARE_PROVIDER_SITE_OTHER): Payer: Self-pay | Admitting: Infectious Diseases

## 2022-08-28 VITALS — BP 108/68 | HR 60 | Temp 97.9°F | Ht 70.0 in | Wt 196.0 lb

## 2022-08-28 DIAGNOSIS — Z6828 Body mass index (BMI) 28.0-28.9, adult: Secondary | ICD-10-CM

## 2022-08-28 DIAGNOSIS — Z23 Encounter for immunization: Secondary | ICD-10-CM | POA: Diagnosis not present

## 2022-08-28 DIAGNOSIS — Z21 Asymptomatic human immunodeficiency virus [HIV] infection status: Secondary | ICD-10-CM

## 2022-08-28 DIAGNOSIS — E669 Obesity, unspecified: Secondary | ICD-10-CM

## 2022-08-28 MED ORDER — ONDANSETRON HCL 4 MG PO TABS: 4.0000 mg | ORAL_TABLET | Freq: Three times a day (TID) | ORAL | 1 refills | Status: DC | PRN

## 2022-08-28 MED ORDER — BIKTARVY 50-200-25 MG PO TABS
ORAL_TABLET | ORAL | 11 refills | Status: DC
Start: 2022-08-28 — End: 2022-12-18

## 2022-08-28 NOTE — Patient Instructions (Addendum)
Nice to see you!  Nausea medicine - you can take 1 or 2 tabs every 8 hours for nausea.   Keep up with your biktarvy everyday.   Will see you back in 6 months

## 2022-08-28 NOTE — Progress Notes (Signed)
Subjective:    Patient ID: Jose Davies, male    DOB: 2000/05/18, 22 y.o.   MRN: [id number]  Chief Complaint  Patient presents with   Follow-up     HPI:  Jose Davies is here for routine follow up for HIV care.Doing well with biktarvy and no concerns  Started semaglutide 4 weeks ago now from another provider. He increased dosed too fast and had bad vomiting with higher dose. Also has no energy and feels fatigue. Considering increasing the dose because he still has cravings for junk food. Has some nausea medicine that helps but needs more and requesting vitamin b12 shot to help with energy.   No concerns over mood. No sexual health concerns.    No Known Allergies    Outpatient Medications Prior to Visit  Medication Sig Dispense Refill   BIKTARVY 50-200-25 MG TABS tablet TAKE 1 TABLET BY MOUTH AT THE SAME TIME EVERY DAY WITH OR WITHOUT FOOD 30 tablet 5   buPROPion (WELLBUTRIN XL) 150 MG 24 hr tablet Take 1 tablet (150 mg total) by mouth daily. (Patient not taking: Reported on 08/28/2022) 30 tablet 1   mupirocin ointment (BACTROBAN) 2 % Apply 1 Application topically 2 (two) times daily. 30 g 0   traZODone (DESYREL) 50 MG tablet Take 1 tablet (50 mg total) by mouth at bedtime. 30 tablet 2   No facility-administered medications prior to visit.     Past Medical History:  Diagnosis Date   ADHD    HIV (human immunodeficiency virus infection) (HCC) 01/30/2017      No past surgical history on file.    Review of Systems  Constitutional:  Negative for chills and fever.  HENT:  Negative for sore throat.   Eyes:  Negative for visual disturbance.  Gastrointestinal:  Positive for nausea. Negative for abdominal pain, anal bleeding and rectal pain.  Genitourinary:  Negative for dysuria, genital sores, penile discharge, penile pain, scrotal swelling and testicular pain.  Musculoskeletal:  Negative for arthralgias and joint swelling.  Skin:  Negative for rash.  Neurological:  Negative for  headaches.  Hematological:  Negative for adenopathy.        Objective:    BP 108/68   Pulse 60   Temp 97.9 F (36.6 C) (Oral)   Ht 5\' 10"  (1.778 m)   Wt 196 lb (88.9 kg)   BMI 28.12 kg/m  Nursing note and vital signs reviewed.  Physical Exam Vitals and nursing note reviewed.  HENT:     Mouth/Throat:     Mouth: No oral lesions.     Dentition: Normal dentition. No dental caries.  Eyes:     General: No scleral icterus. Cardiovascular:     Rate and Rhythm: Normal rate and regular rhythm.     Heart sounds: Normal heart sounds.  Pulmonary:     Effort: Pulmonary effort is normal.     Breath sounds: Normal breath sounds.  Abdominal:     General: There is no distension.     Palpations: Abdomen is soft.     Tenderness: There is no abdominal tenderness.  Lymphadenopathy:     Cervical: No cervical adenopathy.  Skin:    General: Skin is warm and dry.     Findings: No rash.     Comments: Red scattered papules and pustules over bilateral buttocks.   Neurological:     Mental Status: He is alert and oriented to person, place, and time.         Assessment & Plan:  Patient Active Problem List   Diagnosis Date Noted   Obesity (BMI 30.0-34.9) 07/31/2021   High risk medication use 09/06/2019   History of syphilis 03/10/2017   Healthcare maintenance 02/02/2017   HIV (human immunodeficiency virus infection) (HCC) 01/30/2017   ADHD 01/30/2017    Problem List Items Addressed This Visit       Unprioritized   Obesity (BMI 30.0-34.9)    Has lost weight from 84m ago - down to 196 lb. Using semaglutide injections provided by other healthcare provider.  Spent time discussing avoiding fast dose increases as the fatigue and nausea he is feeling are directly related to rapidly escalated dose / medication side effect from the peptide.  Zofran 4-8 mg TID prn provided.  I encouraged him to reach out to ordering provider to inquire about the vitamin B12 for further advice regarding  that.       HIV (human immunodeficiency virus infection) (HCC) - Primary (Chronic)    Virologically and immunologically well controlled on Biktarvy daily. No concerns with access or adherence to medication. They are tolerating the medication well without side effects. No drug interactions identified. Pertinent lab tests ordered today.  Reminded about ADAP re-enrollment in July - has met with them prior to today's visit.  No dental needs today.  No concern over anxious/depressed mood.  Sexual health and family planning discussed - Routine RPR collected, unable to provide urine .  Vaccines updated today - see health maintenance section.   Return in about 6 months (around 02/28/2023).        Relevant Medications   bictegravir-emtricitabine-tenofovir AF (BIKTARVY) 50-200-25 MG TABS tablet   Other Relevant Orders   HIV 1 RNA quant-no reflex-bld   T-helper cells (CD4) count   RPR   Lipid panel   COMPLETE METABOLIC PANEL WITH GFR   Hepatitis B surface antibody,qualitative   Other Visit Diagnoses     Need for meningococcal vaccination       Relevant Orders   MENINGOCOCCAL MCV4O (Completed)   Need for tetanus, diphtheria, and acellular pertussis (Tdap) vaccine       Relevant Orders   Tdap vaccine greater than or equal to 7yo IM (Completed)        [name], MSN, NP-C [facility]  [email or web address] Pager: [phone] Office: [phone] [facility] Main Line: [phone]

## 2022-08-28 NOTE — Assessment & Plan Note (Signed)
Has lost weight from 32m ago - down to 196 lb. Using semaglutide injections provided by other healthcare provider.  Spent time discussing avoiding fast dose increases as the fatigue and nausea he is feeling are directly related to rapidly escalated dose / medication side effect from the peptide.  Zofran 4-8 mg TID prn provided.  I encouraged him to reach out to ordering provider to inquire about the vitamin B12 for further advice regarding that.

## 2022-08-28 NOTE — Assessment & Plan Note (Signed)
Virologically and immunologically well controlled on Biktarvy daily. No concerns with access or adherence to medication. They are tolerating the medication well without side effects. No drug interactions identified. Pertinent lab tests ordered today.  Reminded about ADAP re-enrollment in July - has met with them prior to today's visit.  No dental needs today.  No concern over anxious/depressed mood.  Sexual health and family planning discussed - Routine RPR collected, unable to provide urine .  Vaccines updated today - see health maintenance section.   Return in about 6 months (around 02/28/2023).

## 2022-10-29 ENCOUNTER — Other Ambulatory Visit: Payer: Self-pay

## 2022-11-21 ENCOUNTER — Other Ambulatory Visit: Payer: Self-pay

## 2022-11-21 ENCOUNTER — Ambulatory Visit (INDEPENDENT_AMBULATORY_CARE_PROVIDER_SITE_OTHER): Payer: 59 | Admitting: Infectious Diseases

## 2022-11-21 ENCOUNTER — Encounter: Payer: Self-pay | Admitting: Infectious Diseases

## 2022-11-21 VITALS — BP 118/75 | HR 59 | Temp 98.2°F | Ht 70.0 in | Wt 174.0 lb

## 2022-11-21 DIAGNOSIS — F419 Anxiety disorder, unspecified: Secondary | ICD-10-CM

## 2022-11-21 DIAGNOSIS — Z23 Encounter for immunization: Secondary | ICD-10-CM | POA: Diagnosis not present

## 2022-11-21 DIAGNOSIS — Z0000 Encounter for general adult medical examination without abnormal findings: Secondary | ICD-10-CM

## 2022-11-21 MED ORDER — BUSPIRONE HCL 7.5 MG PO TABS: 7.5000 mg | ORAL_TABLET | Freq: Three times a day (TID) | ORAL | 1 refills | Status: DC

## 2022-11-21 NOTE — Patient Instructions (Addendum)
Talk with your semaglutide provider about maintenance dosing and any experience or recommendations that they may have.   Buspar is the new anxiety medication to help - take one in the morning before work - can repeat in 6-8 hours if needed   Consider a social media break -  Breathwork  EFT Tapping  Online Counseling to look into -  [facility] [email or web address]  [employer]  [email or web address]

## 2022-11-21 NOTE — Assessment & Plan Note (Signed)
Flu vaccine today 

## 2022-11-21 NOTE — Progress Notes (Signed)
Subjective:    Patient ID: Jose Davies, male    DOB: 2000-03-12, 22 y.o.   MRN: [id number]   Chief Complaint  Patient presents with   Follow-up    anxiety     HPI:  Jose Davies is here for visit to discuss anxiety.   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 Gets extremely anxious prior to communication with clients; physical stuttering to where he has a hard time focusing on words and communicating. Other physical symptoms of rapid heart beat. He tries to control with breathing but does not always help. Tearful in discussing as it is becoming very troublesome for him. No longer taking the wellbutrin but does not recall that it helped much for mood in the past. Does not enjoy his job.   Does not want to regain weight - continues on semaglutide from another provider. Down 20 lbs since July. Feels as if he is eating enough and sleeping well.    Has had a history of ADHD as a child - previously did not like the feeling of the medications for this and felt like "a zombie." Not interested in resuming medications for this.    No Known Allergies    Outpatient Medications Prior to Visit  Medication Sig Dispense Refill   bictegravir-emtricitabine-tenofovir AF (BIKTARVY) 50-200-25 MG TABS tablet TAKE 1 TABLET BY MOUTH AT THE SAME TIME EVERY DAY WITH OR WITHOUT FOOD 30 tablet 11   buPROPion (WELLBUTRIN XL) 150 MG 24 hr tablet Take 1 tablet (150 mg total) by mouth daily. (Patient not taking: Reported on 08/28/2022) 30 tablet 1   ondansetron (ZOFRAN) 4 MG tablet Take 1-2 tablets (4-8 mg total) by mouth every 8 (eight) hours as needed for nausea or vomiting. (Patient not taking: Reported on 11/21/2022) 20 tablet 1   No facility-administered medications prior to visit.     Past Medical History:  Diagnosis Date   ADHD    HIV (human immunodeficiency virus infection)  (HCC) 01/30/2017      No past surgical history on file.    Review of Systems  Constitutional:  Negative for fatigue.  Cardiovascular:  Positive for palpitations.  Psychiatric/Behavioral:  Positive for decreased concentration. Negative for agitation, dysphoric mood and sleep disturbance. The patient is nervous/anxious. The patient is not hyperactive.         Objective:    BP 118/75   Pulse (!) 59   Temp 98.2 F (36.8 C) (Temporal)   Ht 5\' 10"  (1.778 m)   Wt 174 lb (78.9 kg)   SpO2 100%   BMI 24.97 kg/m  Nursing note and vital signs reviewed.  Physical Exam Constitutional:      Appearance: Normal appearance. He is not ill-appearing.  HENT:     Head: Normocephalic.     Mouth/Throat:     Mouth: Mucous membranes are moist.     Pharynx: Oropharynx is clear.  Eyes:     General: No scleral icterus. Pulmonary:     Effort: Pulmonary effort is normal.  Musculoskeletal:        General: Normal range of motion.     Cervical back: Normal range of motion.  Skin:    Coloration: Skin is not jaundiced or pale.  Neurological:     Mental Status: He is alert and oriented to person, place, and time.  Psychiatric:  Attention and Perception: He is attentive.        Mood and Affect: Mood is anxious. Affect is tearful.        Speech: Speech normal.        Behavior: Behavior normal.        Thought Content: Thought content normal.        Judgment: Judgment normal.         Assessment & Plan:   Patient Active Problem List   Diagnosis Date Noted   Anxiety 11/21/2022   Obesity (BMI 30.0-34.9) 07/31/2021   High risk medication use 09/06/2019   History of syphilis 03/10/2017   Healthcare maintenance 02/02/2017   HIV (human immunodeficiency virus infection) (HCC) 01/30/2017   ADHD 01/30/2017    Problem List Items Addressed This Visit       Unprioritized   Healthcare maintenance    Flu vaccine today       Anxiety    Physical symptoms of anxiety that have started  the last few months. Stuttering and palpitations. This is distressing for him.  We discussed treatment options and I think he would do very well with counseling and other guided work to help decrease anxiety symptoms (tapping and breathwork he will look into). Resources provided for online counseling with [employer] and [facility] to see if there is some better flexibility for sessions around his work schedule.   Start buspar 7.5 mg TID as needed - would do well to try one in the morning before he goes out to visit with clients at work to see if this helps, can repeat in 6-8h if needed.  Will have him back in 4-6 weeks (prefers in person) to discuss further and see how we can help.       Relevant Medications   busPIRone (BUSPAR) 7.5 MG tablet   Other Visit Diagnoses     Encounter for immunization    -  Primary   Relevant Orders   Flu vaccine trivalent PF, 6mos and older(Flulaval,Afluria,Fluarix,Fluzone) (Completed)      [name], MSN, NP-C [facility]  [email or web address] Pager: [phone] Office: [phone] [facility] Main Line: [phone]

## 2022-11-21 NOTE — Assessment & Plan Note (Signed)
Physical symptoms of anxiety that have started the last few months. Stuttering and palpitations. This is distressing for him.  We discussed treatment options and I think he would do very well with counseling and other guided work to help decrease anxiety symptoms (tapping and breathwork he will look into). Resources provided for online counseling with [employer] and [facility] to see if there is some better flexibility for sessions around his work schedule.   Start buspar 7.5 mg TID as needed - would do well to try one in the morning before he goes out to visit with clients at work to see if this helps, can repeat in 6-8h if needed.  Will have him back in 4-6 weeks (prefers in person) to discuss further and see how we can help.

## 2022-11-24 ENCOUNTER — Telehealth: Payer: Self-pay

## 2022-11-24 ENCOUNTER — Other Ambulatory Visit (HOSPITAL_COMMUNITY): Payer: Self-pay

## 2022-11-24 NOTE — Telephone Encounter (Signed)
Received the following message from patient.   Hi so Jose Davies sent out a prescription for buspirone yesterday and the pharmacy said that the insurance doesn't cover it. I really would like to have some type of anxiety meds as soon as possible is there a way I can get any that are covered by my insurance?   Will forward message to provider. Jose Davies, RMA

## 2022-12-04 ENCOUNTER — Telehealth: Payer: Self-pay | Admitting: Infectious Diseases

## 2022-12-04 DIAGNOSIS — B351 Tinea unguium: Secondary | ICD-10-CM | POA: Diagnosis not present

## 2022-12-04 DIAGNOSIS — M2041 Other hammer toe(s) (acquired), right foot: Secondary | ICD-10-CM | POA: Diagnosis not present

## 2022-12-04 DIAGNOSIS — F985 Adult onset fluency disorder: Secondary | ICD-10-CM

## 2022-12-04 DIAGNOSIS — M2042 Other hammer toe(s) (acquired), left foot: Secondary | ICD-10-CM | POA: Diagnosis not present

## 2022-12-04 DIAGNOSIS — B353 Tinea pedis: Secondary | ICD-10-CM | POA: Diagnosis not present

## 2022-12-04 DIAGNOSIS — D492 Neoplasm of unspecified behavior of bone, soft tissue, and skin: Secondary | ICD-10-CM | POA: Diagnosis not present

## 2022-12-04 DIAGNOSIS — L603 Nail dystrophy: Secondary | ICD-10-CM | POA: Diagnosis not present

## 2022-12-04 NOTE — Telephone Encounter (Signed)
Jose Davies called requesting a speech therapy referral be sent to [facility] in [place].  Fax number: [phone]

## 2022-12-05 NOTE — Addendum Note (Signed)
Addended by: Blanchard Kelch on: 12/05/2022 01:02 PM   Modules accepted: Orders

## 2022-12-05 NOTE — Telephone Encounter (Signed)
Referral placed for stuttering to [facility] as patient requested for further evaluation into stuttering.

## 2022-12-17 ENCOUNTER — Other Ambulatory Visit (HOSPITAL_COMMUNITY): Payer: Self-pay

## 2022-12-18 ENCOUNTER — Other Ambulatory Visit: Payer: Self-pay

## 2022-12-18 DIAGNOSIS — Z21 Asymptomatic human immunodeficiency virus [HIV] infection status: Secondary | ICD-10-CM

## 2022-12-18 MED ORDER — BIKTARVY 50-200-25 MG PO TABS: ORAL_TABLET | ORAL | 5 refills | Status: DC

## 2022-12-26 ENCOUNTER — Ambulatory Visit: Payer: 59 | Admitting: Infectious Diseases

## 2023-01-06 ENCOUNTER — Telehealth: Payer: Self-pay

## 2023-01-06 NOTE — Telephone Encounter (Signed)
[facility] Patient Advocate Encounter   Was successful in obtaining a [employer] copay card for Biktarvy.  This copay card will make the patients copay $0.00.  I have spoken with the patient.         Brande Uncapher, CPhT Specialty Pharmacy Patient [facility] Phone: [phone] Fax:  [phone]  

## 2023-01-09 ENCOUNTER — Other Ambulatory Visit (HOSPITAL_COMMUNITY): Payer: Self-pay

## 2023-01-13 ENCOUNTER — Other Ambulatory Visit: Payer: Self-pay

## 2023-01-13 ENCOUNTER — Emergency Department (HOSPITAL_COMMUNITY)
Admission: EM | Admit: 2023-01-13 | Discharge: 2023-01-13 | Discharge status: Against Medical Advice | Payer: 59 | Attending: Emergency Medicine | Admitting: Emergency Medicine

## 2023-01-13 ENCOUNTER — Ambulatory Visit (HOSPITAL_COMMUNITY)
Admission: EM | Admit: 2023-01-13 | Discharge: 2023-01-13 | Disposition: A | Discharge status: Home / Self Care | Payer: 59 | Attending: Family Medicine | Admitting: Family Medicine

## 2023-01-13 ENCOUNTER — Encounter (HOSPITAL_COMMUNITY): Payer: Self-pay

## 2023-01-13 DIAGNOSIS — R1110 Vomiting, unspecified: Secondary | ICD-10-CM | POA: Diagnosis not present

## 2023-01-13 DIAGNOSIS — R109 Unspecified abdominal pain: Secondary | ICD-10-CM | POA: Insufficient documentation

## 2023-01-13 DIAGNOSIS — Z5321 Procedure and treatment not carried out due to patient leaving prior to being seen by health care provider: Secondary | ICD-10-CM | POA: Diagnosis not present

## 2023-01-13 DIAGNOSIS — R1013 Epigastric pain: Secondary | ICD-10-CM

## 2023-01-13 DIAGNOSIS — R112 Nausea with vomiting, unspecified: Secondary | ICD-10-CM

## 2023-01-13 MED ORDER — ALUM & MAG HYDROXIDE-SIMETH 200-200-20 MG/5ML PO SUSP
30.0000 mL | Freq: Once | ORAL | Status: AC
  Administered 2023-01-13: 30 mL via ORAL

## 2023-01-13 MED ORDER — ONDANSETRON 4 MG PO TBDP
ORAL_TABLET | ORAL | Status: AC
  Filled 2023-01-13: qty 1

## 2023-01-13 MED ORDER — LIDOCAINE VISCOUS HCL 2 % MT SOLN
15.0000 mL | Freq: Once | OROMUCOSAL | Status: AC
  Administered 2023-01-13: 15 mL via OROMUCOSAL

## 2023-01-13 MED ORDER — ONDANSETRON 4 MG PO TBDP
4.0000 mg | ORAL_TABLET | Freq: Once | ORAL | Status: AC
  Administered 2023-01-13: 4 mg via ORAL

## 2023-01-13 MED ORDER — LIDOCAINE VISCOUS HCL 2 % MT SOLN
OROMUCOSAL | Status: AC
  Filled 2023-01-13: qty 15

## 2023-01-13 MED ORDER — ONDANSETRON 4 MG PO TBDP: 4.0000 mg | ORAL_TABLET | Freq: Three times a day (TID) | ORAL | 0 refills | Status: DC | PRN

## 2023-01-13 MED ORDER — ALUM & MAG HYDROXIDE-SIMETH 200-200-20 MG/5ML PO SUSP
ORAL | Status: AC
  Filled 2023-01-13: qty 30

## 2023-01-13 MED ORDER — METOCLOPRAMIDE HCL 5 MG/ML IJ SOLN
5.0000 mg | Freq: Once | INTRAMUSCULAR | Status: AC
  Administered 2023-01-13: 5 mg via INTRAMUSCULAR

## 2023-01-13 MED ORDER — METOCLOPRAMIDE HCL 5 MG/ML IJ SOLN
INTRAMUSCULAR | Status: AC
  Filled 2023-01-13: qty 2

## 2023-01-13 MED ORDER — OMEPRAZOLE 40 MG PO CPDR: 40.0000 mg | DELAYED_RELEASE_CAPSULE | Freq: Every day | ORAL | 1 refills | Status: AC

## 2023-01-13 NOTE — ED Provider Notes (Signed)
[facility]    CSN: [id number] Arrival date & time: 01/13/23  9528      History   Chief Complaint No chief complaint on file.   HPI Jose Davies is a 22 y.o. male.   HPI Here for vomiting and epigastric abdominal pain. Yesterday after he woke up he threw up numerous times, possibly about 30.  He had last thrown up about 2 in the morning this morning, then threw up about 10:00 after he tried to drink some ginger ale.  He has also been hiccuping and belching a lot.  No fever and no diarrhea.  Last bowel movement was yesterday.  He has been back on semaglutide for 3 weeks, and this last week he had injected a higher dose since he was not having any adverse effects.  No cough or cold symptoms  No allergies to medications  Past medical history includes HIV positive with undetectable viral load at last lab draw.  He had restarted the semaglutide because he had gained a little bit of weight back. He rates the pain 10 out of 10 in his epigastric area.  It started with the vomiting yesterday morning.  He did have an episode where he had some blood and what he threw up, yesterday evening.  He has not seen blood in the emesis since.  Past Medical History:  Diagnosis Date   ADHD    HIV (human immunodeficiency virus infection) (HCC) 01/30/2017    Patient Active Problem List   Diagnosis Date Noted   Anxiety 11/21/2022   Obesity (BMI 30.0-34.9) 07/31/2021   High risk medication use 09/06/2019   History of syphilis 03/10/2017   Healthcare maintenance 02/02/2017   HIV (human immunodeficiency virus infection) (HCC) 01/30/2017   ADHD 01/30/2017    History reviewed. No pertinent surgical history.     Home Medications    Prior to Admission medications   Medication Sig Start Date End Date Taking? Authorizing Provider  bictegravir-emtricitabine-tenofovir AF (BIKTARVY) 50-200-25 MG TABS tablet TAKE 1 TABLET BY MOUTH AT THE SAME TIME EVERY DAY WITH OR WITHOUT FOOD  12/18/22  Yes Blanchard Kelch, NP  omeprazole (PRILOSEC) 40 MG capsule Take 1 capsule (40 mg total) by mouth daily. 01/13/23  Yes Zenia Resides, MD  ondansetron (ZOFRAN-ODT) 4 MG disintegrating tablet Take 1 tablet (4 mg total) by mouth every 8 (eight) hours as needed for nausea or vomiting. 01/13/23  Yes Lorie Cleckley, Janace Aris, MD    Family History Family History  Problem Relation Age of Onset   Jose Davies    Jose Davies     Social History Social History   Tobacco Use   Smoking status: Former    Types: E-cigarettes   Smokeless tobacco: Never  Vaping Use   Vaping status: Never Used  Substance Use Topics   Alcohol use: Yes    Alcohol/week: 1.0 standard drink of alcohol    Types: 1 Cans of beer per week    Comment: Weekends   Drug use: Not Currently    Frequency: 7.0 times per week    Types: Marijuana     Allergies   Patient has no known allergies.   Review of Systems Review of Systems   Physical Exam Triage Vital Signs ED Triage Vitals [01/13/23 0915]  Encounter Vitals Group     BP 114/71     Systolic BP Percentile      Diastolic BP Percentile      Pulse Rate 72     Resp  18     Temp 98.9 F (37.2 C)     Temp Source Oral     SpO2 100 %     Weight      Height      Head Circumference      Peak Flow      Pain Score      Pain Loc      Pain Education      Exclude from Growth Chart    No data found.  Updated Vital Signs BP 114/71 (BP Location: Left Arm)   Pulse 72   Temp 98.9 F (37.2 C) (Oral)   Resp 18   SpO2 100%   Visual Acuity Right Eye Distance:   Left Eye Distance:   Bilateral Distance:    Right Eye Near:   Left Eye Near:    Bilateral Near:     Physical Exam Vitals reviewed.  Constitutional:      General: He is not in acute distress.    Appearance: He is not ill-appearing, toxic-appearing or diaphoretic.     Comments: He does have pained facies and is bending over holding his abdomen when I walk in the room.  HENT:      Mouth/Throat:     Mouth: Mucous membranes are moist.  Eyes:     Extraocular Movements: Extraocular movements intact.     Conjunctiva/sclera: Conjunctivae normal.     Pupils: Pupils are equal, round, and reactive to light.  Cardiovascular:     Rate and Rhythm: Normal rate and regular rhythm.     Heart sounds: No murmur heard. Pulmonary:     Effort: Pulmonary effort is normal.     Breath sounds: Normal breath sounds.  Abdominal:     General: Bowel sounds are normal. There is no distension.     Palpations: Abdomen is soft.     Tenderness: There is abdominal tenderness (epigastric). There is no guarding.  Musculoskeletal:     Cervical back: Neck supple.  Lymphadenopathy:     Cervical: No cervical adenopathy.  Skin:    Coloration: Skin is not jaundiced or pale.  Neurological:     General: No focal deficit present.     Mental Status: He is alert and oriented to person, place, and time.      UC Treatments / Results  Labs (all labs ordered are listed, but only abnormal results are displayed) Labs Reviewed - No data to display  EKG   Radiology No results found.  Procedures Procedures (including critical care time)  Medications Ordered in UC Medications  metoCLOPramide (REGLAN) injection 5 mg (has no administration in time range)  ondansetron (ZOFRAN-ODT) disintegrating tablet 4 mg (4 mg Oral Given 01/13/23 1018)  alum & mag hydroxide-simeth (MAALOX/MYLANTA) 200-200-20 MG/5ML suspension 30 mL (30 mLs Oral Given 01/13/23 1044)  lidocaine (XYLOCAINE) 2 % viscous mouth solution 15 mL (15 mLs Mouth/Throat Given 01/13/23 1044)    Initial Impression / Assessment and Plan / UC Course  I have reviewed the triage vital signs and the nursing notes.  Pertinent labs & imaging results that were available during my care of the patient were reviewed by me and considered in my medical decision making (see chart for details).     First ODT Zofran is administered and then about 20  minutes later GI cocktail was ordered. Both medications were tolerated and he did keep the GI cocktail down.  It is given him a small amount of relief.  We discussed going to the emergency  room, but first we are going to try giving him a dose of Reglan by injection and I am sending omeprazole and Zofran to the pharmacy.  If he worsens in any way or the pain relief provided does not continue to improve, I want him to go to the emergency room for further evaluation. Final Clinical Impressions(s) / UC Diagnoses   Final diagnoses:  Abdominal pain, epigastric  Nausea and vomiting, unspecified vomiting type     Discharge Instructions      Take omeprazole 40 mg--1 capsule daily for stomach acid.  Ondansetron dissolved in the mouth every 8 hours as needed for nausea or vomiting. Clear liquids(water, gatorade/pedialyte, ginger ale/sprite, chicken broth/soup) and bland things(crackers/toast, rice, potato, bananas) to eat. Avoid acidic foods like lemon/lime/orange/tomato, and avoid greasy/spicy foods.  Here we gave you 1 dose of the ondansetron  Then we gave you a dose of Maalox plus viscous lidocaine.  Then you were given a shot of metoclopramide/Reglan.  If you do not continue to improve today or if you worsen anyway, please go to the emergency room for further evaluation and treatment.     ED Prescriptions     Medication Sig Dispense Auth. Provider   omeprazole (PRILOSEC) 40 MG capsule Take 1 capsule (40 mg total) by mouth daily. 30 capsule Zenia Resides, MD   ondansetron (ZOFRAN-ODT) 4 MG disintegrating tablet Take 1 tablet (4 mg total) by mouth every 8 (eight) hours as needed for nausea or vomiting. 10 tablet Marlinda Mike Janace Aris, MD      PDMP not reviewed this encounter.   Zenia Resides, MD 01/13/23 [phone]

## 2023-01-13 NOTE — ED Triage Notes (Signed)
Pt reports to office with c/o nausea and vomiting after increasing his dose of semiglutide. Pt reports he has hiccups and yesterday started coughing up blood.  Pain 10/10

## 2023-01-13 NOTE — ED Triage Notes (Signed)
Pt arrived from home via POV c/o abd pain and emesis d/t semiglutide. Pt states that it caused the same symptoms in the past. 10/10 abd cramps

## 2023-01-13 NOTE — ED Notes (Signed)
Pt name called multiple times, pt did not answer.

## 2023-01-13 NOTE — Discharge Instructions (Addendum)
Take omeprazole 40 mg--1 capsule daily for stomach acid.  Ondansetron dissolved in the mouth every 8 hours as needed for nausea or vomiting. Clear liquids(water, gatorade/pedialyte, ginger ale/sprite, chicken broth/soup) and bland things(crackers/toast, rice, potato, bananas) to eat. Avoid acidic foods like lemon/lime/orange/tomato, and avoid greasy/spicy foods.  Here we gave you 1 dose of the ondansetron  Then we gave you a dose of Maalox plus viscous lidocaine.  Then you were given a shot of metoclopramide/Reglan.  If you do not continue to improve today or if you worsen anyway, please go to the emergency room for further evaluation and treatment.

## 2023-01-14 ENCOUNTER — Emergency Department (HOSPITAL_BASED_OUTPATIENT_CLINIC_OR_DEPARTMENT_OTHER): Payer: 59

## 2023-01-14 ENCOUNTER — Other Ambulatory Visit: Payer: Self-pay

## 2023-01-14 ENCOUNTER — Encounter (HOSPITAL_BASED_OUTPATIENT_CLINIC_OR_DEPARTMENT_OTHER): Payer: Self-pay | Admitting: Emergency Medicine

## 2023-01-14 ENCOUNTER — Emergency Department (HOSPITAL_BASED_OUTPATIENT_CLINIC_OR_DEPARTMENT_OTHER)
Admission: EM | Admit: 2023-01-14 | Discharge: 2023-01-14 | Disposition: A | Discharge status: Home / Self Care | Payer: 59 | Attending: Emergency Medicine | Admitting: Emergency Medicine

## 2023-01-14 DIAGNOSIS — Z21 Asymptomatic human immunodeficiency virus [HIV] infection status: Secondary | ICD-10-CM | POA: Diagnosis not present

## 2023-01-14 DIAGNOSIS — Z87891 Personal history of nicotine dependence: Secondary | ICD-10-CM | POA: Insufficient documentation

## 2023-01-14 DIAGNOSIS — R112 Nausea with vomiting, unspecified: Secondary | ICD-10-CM | POA: Insufficient documentation

## 2023-01-14 DIAGNOSIS — R1013 Epigastric pain: Secondary | ICD-10-CM | POA: Diagnosis not present

## 2023-01-14 DIAGNOSIS — R109 Unspecified abdominal pain: Secondary | ICD-10-CM

## 2023-01-14 DIAGNOSIS — R079 Chest pain, unspecified: Secondary | ICD-10-CM | POA: Diagnosis not present

## 2023-01-14 LAB — CBC
HCT: 42.9 % (ref 39.0–52.0)
Hemoglobin: 14.8 g/dL (ref 13.0–17.0)
MCH: 29.1 pg (ref 26.0–34.0)
MCHC: 34.5 g/dL (ref 30.0–36.0)
MCV: 84.3 fL (ref 80.0–100.0)
Platelets: 210 10*3/uL (ref 150–400)
RBC: 5.09 MIL/uL (ref 4.22–5.81)
RDW: 12.1 % (ref 11.5–15.5)
WBC: 8.5 10*3/uL (ref 4.0–10.5)
nRBC: 0 % (ref 0.0–0.2)

## 2023-01-14 LAB — COMPREHENSIVE METABOLIC PANEL
ALT: 12 U/L (ref 0–44)
AST: 17 U/L (ref 15–41)
Albumin: 4.4 g/dL (ref 3.5–5.0)
Alkaline Phosphatase: 36 U/L — ABNORMAL LOW (ref 38–126)
Anion gap: 8 (ref 5–15)
BUN: 17 mg/dL (ref 6–20)
CO2: 30 mmol/L (ref 22–32)
Calcium: 9.5 mg/dL (ref 8.9–10.3)
Chloride: 99 mmol/L (ref 98–111)
Creatinine, Ser: 1.41 mg/dL — ABNORMAL HIGH (ref 0.61–1.24)
GFR, Estimated: >60 mL/min (ref >60)
Glucose, Bld: 93 mg/dL (ref 70–99)
Potassium: 3.5 mmol/L (ref 3.5–5.1)
Sodium: 137 mmol/L (ref 135–145)
Total Bilirubin: 1.1 mg/dL (ref <1.2)
Total Protein: 7.1 g/dL (ref 6.5–8.1)

## 2023-01-14 LAB — LIPASE, BLOOD: Lipase: 74 U/L — ABNORMAL HIGH (ref 11–51)

## 2023-01-14 MED ORDER — SODIUM CHLORIDE 0.9 % IV BOLUS
1000.0000 mL | Freq: Once | INTRAVENOUS | Status: AC
  Administered 2023-01-14: 1000 mL via INTRAVENOUS

## 2023-01-14 MED ORDER — ONDANSETRON HCL 4 MG/2ML IJ SOLN
4.0000 mg | Freq: Once | INTRAMUSCULAR | Status: AC
  Administered 2023-01-14: 4 mg via INTRAVENOUS
  Filled 2023-01-14: qty 2

## 2023-01-14 MED ORDER — ALUM & MAG HYDROXIDE-SIMETH 200-200-20 MG/5ML PO SUSP
30.0000 mL | Freq: Once | ORAL | Status: AC
  Administered 2023-01-14: 30 mL via ORAL
  Filled 2023-01-14: qty 30

## 2023-01-14 MED ORDER — FAMOTIDINE IN NACL 20-0.9 MG/50ML-% IV SOLN
20.0000 mg | Freq: Once | INTRAVENOUS | Status: AC
  Administered 2023-01-14: 20 mg via INTRAVENOUS
  Filled 2023-01-14: qty 50

## 2023-01-14 MED ORDER — SUCRALFATE 1 G PO TABS: 1.0000 g | ORAL_TABLET | Freq: Four times a day (QID) | ORAL | 0 refills | Status: DC | PRN

## 2023-01-14 MED ORDER — FENTANYL CITRATE PF 50 MCG/ML IJ SOSY
50.0000 ug | PREFILLED_SYRINGE | Freq: Once | INTRAMUSCULAR | Status: AC
Start: 2023-01-14 — End: 2023-01-14
  Administered 2023-01-14: 50 ug via INTRAVENOUS
  Filled 2023-01-14: qty 1

## 2023-01-14 NOTE — ED Provider Notes (Signed)
[facility] Emergency Department Provider Note MRN:  [id number]  Arrival date & time: 01/14/23     Chief Complaint   Abdominal Pain   History of Present Illness   Jose Davies is a 22 y.o. year-old male with a history of HIV presenting to the ED with chief complaint of abdominal pain.  Epigastric abdominal pain with radiation into the chest described as a burning fire.  Yesterday was having a lot of vomiting up to 20 or 30 episodes.  Attributes this to an increased dose of his semaglutide medicine.  After several episodes of vomiting noticed some streaks of blood in the vomit.  Some diarrhea but no blood in the stool.  Review of Systems  A thorough review of systems was obtained and all systems are negative except as noted in the HPI and PMH.   Patient's Health History    Past Medical History:  Diagnosis Date   ADHD    HIV (human immunodeficiency virus infection) (HCC) 01/30/2017    History reviewed. No pertinent surgical history.  Family History  Problem Relation Age of Onset   Healthy Mother    Healthy Father     Social History   Socioeconomic History   Marital status: Single    Spouse name: Not on file   Number of children: Not on file   Years of education: Not on file   Highest education level: Not on file  Occupational History   Not on file  Tobacco Use   Smoking status: Former    Types: E-cigarettes   Smokeless tobacco: Never  Vaping Use   Vaping status: Never Used  Substance and Sexual Activity   Alcohol use: Yes    Alcohol/week: 1.0 standard drink of alcohol    Types: 1 Cans of beer per week    Comment: Weekends   Drug use: Not Currently    Frequency: 7.0 times per week    Types: Marijuana   Sexual activity: Not Currently    Partners: Male    Birth control/protection: None    Comment: declined condoms  Other Topics Concern   Not on file  Social History Narrative   Not on file   Social Determinants of Health    Financial Resource Strain: Not on file  Food Insecurity: Not on file  Transportation Needs: Not on file  Physical Activity: Not on file  Stress: Not on file  Social Connections: Not on file  Intimate Partner Violence: Not on file     Physical Exam   Vitals:   01/14/23 0530 01/14/23 0608  BP: (!) 93/54 101/60  Pulse: (!) 54 (!) 56  Resp:  16  Temp:    SpO2: 100% 100%    CONSTITUTIONAL: Well-appearing, NAD NEURO/PSYCH:  Alert and oriented x 3, no focal deficits EYES:  eyes equal and reactive ENT/NECK:  no LAD, no JVD CARDIO: Regular rate, well-perfused, normal S1 and S2 PULM:  CTAB no wheezing or rhonchi GI/GU:  non-distended, non-tender MSK/SPINE:  No gross deformities, no edema SKIN:  no rash, atraumatic   *Additional and/or pertinent findings included in MDM below  Diagnostic and Interventional Summary    EKG Interpretation Date/Time:  Wednesday January 14 2023 05:51:06 EST Ventricular Rate:  57 PR Interval:  152 QRS Duration:  104 QT Interval:  427 QTC Calculation: 416 R Axis:   77  Text Interpretation: Sinus rhythm ST elev, probable normal early repol pattern Confirmed by Kennis Carina [phone]) on 01/14/2023 5:53:59 AM  Labs Reviewed  COMPREHENSIVE METABOLIC PANEL - Abnormal; Notable for the following components:      Result Value   Creatinine, Ser 1.41 (*)    Alkaline Phosphatase 36 (*)    All other components within normal limits  LIPASE, BLOOD - Abnormal; Notable for the following components:   Lipase 74 (*)    All other components within normal limits  CBC    DG Chest Port 1 View  Final Result      Medications  sodium chloride 0.9 % bolus 1,000 mL (0 mLs Intravenous Stopped 01/14/23 0607)  ondansetron (ZOFRAN) injection 4 mg (4 mg Intravenous Given 01/14/23 0441)  famotidine (PEPCID) IVPB 20 mg premix (0 mg Intravenous Stopped 01/14/23 0539)  fentaNYL (SUBLIMAZE) injection 50 mcg (50 mcg Intravenous Given 01/14/23 0444)  alum & mag  hydroxide-simeth (MAALOX/MYLANTA) 200-200-20 MG/5ML suspension 30 mL (30 mLs Oral Given 01/14/23 0546)     Procedures  /  Critical Care Procedures  ED Course and Medical Decision Making  Initial Impression and Ddx Suspect either gastroenteritis or side effect of semaglutide causing a lot of vomiting, possibly Mallory-Weiss tear given the small amount of blood.  Providing symptomatic management of checking labs and will reassess.  Question significant blood loss, electrolyte disarray, AKI  Past medical/surgical history that increases complexity of ED encounter: HIV  Interpretation of Diagnostics I personally reviewed the EKG and my interpretation is as follows: Sinus rhythm  Chest x-ray normal, labs without significant blood count or electrolyte disturbance  Patient Reassessment and Ultimate Disposition/Management     Discharge  Patient management required discussion with the following services or consulting groups:  None  Complexity of Problems Addressed Acute illness or injury that poses threat of life of bodily function  Additional Data Reviewed and Analyzed Further history obtained from: None  Additional Factors Impacting ED Encounter Risk Prescriptions  Elmer Sow. Pilar Plate, MD [facility] Emergency Medicine [facility] [email or web address]  Final Clinical Impressions(s) / ED Diagnoses     ICD-10-CM   1. Nausea and vomiting, unspecified vomiting type  R11.2     2. Abdominal pain, unspecified abdominal location  R10.9       ED Discharge Orders          Ordered    sucralfate (CARAFATE) 1 g tablet  4 times daily PRN        01/14/23 0556             Discharge Instructions Discussed with and Provided to Patient:     Discharge Instructions      You were evaluated in the Emergency Department and after careful evaluation, we did not find any emergent condition requiring admission or further testing in the hospital.  Your exam/testing today is  overall reassuring.  Symptoms could be due to a viral gastritis or medication side effect.  Use the Zofran as needed for nausea, use the omeprazole daily to prevent pain, use the Carafate medication as needed for immediate relief.  Please return to the Emergency Department if you experience any worsening of your condition.   Thank you for allowing [place] to be a part of your care.       Sabas Sous, MD 01/14/23 [phone]

## 2023-01-14 NOTE — ED Triage Notes (Signed)
Patient presents with epigastric pain and nausea x 2 days. Reports recently increasing Semiglutide dose and thinks sx related to same.

## 2023-01-14 NOTE — Discharge Instructions (Signed)
You were evaluated in the Emergency Department and after careful evaluation, we did not find any emergent condition requiring admission or further testing in the hospital.  Your exam/testing today is overall reassuring.  Symptoms could be due to a viral gastritis or medication side effect.  Use the Zofran as needed for nausea, use the omeprazole daily to prevent pain, use the Carafate medication as needed for immediate relief.  Please return to the Emergency Department if you experience any worsening of your condition.   Thank you for allowing [place] to be a part of your care.

## 2023-02-12 ENCOUNTER — Telehealth: Payer: Self-pay

## 2023-02-12 MED ORDER — ONDANSETRON 4 MG PO TBDP: 4.0000 mg | ORAL_TABLET | Freq: Three times a day (TID) | ORAL | 0 refills | Status: AC | PRN

## 2023-02-12 NOTE — Telephone Encounter (Signed)
 He really needs to stop increasing his dose of semaglutide (not prescribed by me....)   I can refill his zofran but the others we can talk about at the visit. I will send him a mychart message.   Thank you

## 2023-02-12 NOTE — Telephone Encounter (Signed)
 Pt called stating that he was recently seen at the ED and Urgent Care for abdominal pain. He stated that there were some prescriptions that was sent in (Zofran , Omeprazole , and Carafate ). He called them to possibly refill, but he would need to request from his PCP. He stated that he only sees [facility] and asked if she could send these refills in for him.   I did let the pt know I am unable to advise on this, but I will have someone review and will reach back out.   Verified pt contact - [phone]

## 2023-02-12 NOTE — Addendum Note (Signed)
 Addended by: Blanchard Kelch on: 02/12/2023 11:22 AM   Modules accepted: Orders

## 2023-02-19 ENCOUNTER — Ambulatory Visit: Payer: 59 | Admitting: Infectious Diseases

## 2023-03-10 DIAGNOSIS — B354 Tinea corporis: Secondary | ICD-10-CM | POA: Diagnosis not present

## 2023-03-20 DIAGNOSIS — Z1152 Encounter for screening for COVID-19: Secondary | ICD-10-CM | POA: Diagnosis not present

## 2023-03-20 DIAGNOSIS — R112 Nausea with vomiting, unspecified: Secondary | ICD-10-CM | POA: Diagnosis not present

## 2023-03-20 DIAGNOSIS — R0789 Other chest pain: Secondary | ICD-10-CM | POA: Diagnosis not present

## 2023-03-20 DIAGNOSIS — R079 Chest pain, unspecified: Secondary | ICD-10-CM | POA: Diagnosis not present

## 2023-03-20 DIAGNOSIS — A084 Viral intestinal infection, unspecified: Secondary | ICD-10-CM | POA: Diagnosis not present

## 2023-03-20 DIAGNOSIS — R1013 Epigastric pain: Secondary | ICD-10-CM | POA: Diagnosis not present

## 2023-04-18 DIAGNOSIS — R079 Chest pain, unspecified: Secondary | ICD-10-CM | POA: Diagnosis not present

## 2023-04-18 DIAGNOSIS — R1013 Epigastric pain: Secondary | ICD-10-CM | POA: Diagnosis not present

## 2023-12-16 ENCOUNTER — Telehealth: Payer: Self-pay
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
## 2024-01-12 ENCOUNTER — Other Ambulatory Visit: Payer: Self-pay | Admitting: Infectious Diseases

## 2024-01-12 DIAGNOSIS — Z21 Asymptomatic human immunodeficiency virus [HIV] infection status: Secondary | ICD-10-CM

## 2024-01-13 NOTE — Progress Notes (Deleted)
 NEW REFERRAL TO CPP CLINIC      HPI: Jose Davies is a 23 y.o. male who presents to the [facility] pharmacy clinic for HIV follow-up.  Referring ID Physician: Corean Fireman, NP  Patient Active Problem List   Diagnosis Date Noted   Anxiety 11/21/2022   Obesity (BMI 30.0-34.9) 07/31/2021   High risk medication use 09/06/2019   History of syphilis 03/10/2017   Healthcare maintenance 02/02/2017   HIV (human immunodeficiency virus infection) (HCC) 01/30/2017   ADHD 01/30/2017    Patient's Medications  New Prescriptions   No medications on file  Previous Medications   BICTEGRAVIR-EMTRICITABINE-TENOFOVIR AF (BIKTARVY ) 50-200-25 MG TABS TABLET    TAKE 1 TABLET BY MOUTH AT THE SAME TIME EVERY DAY WITH OR WITHOUT FOOD   OMEPRAZOLE  (PRILOSEC) 40 MG CAPSULE    Take 1 capsule (40 mg total) by mouth daily.   ONDANSETRON  (ZOFRAN -ODT) 4 MG DISINTEGRATING TABLET    Take 1 tablet (4 mg total) by mouth every 8 (eight) hours as needed for nausea or vomiting.   SUCRALFATE  (CARAFATE ) 1 G TABLET    Take 1 tablet (1 g total) by mouth 4 (four) times daily as needed.  Modified Medications   No medications on file  Discontinued Medications   No medications on file    Allergies: No Known Allergies  Past Medical History: Past Medical History:  Diagnosis Date   ADHD    HIV (human immunodeficiency virus infection) (HCC) 01/30/2017    Social History: Social History   Socioeconomic History   Marital status: Single    Spouse name: Not on file   Number of children: Not on file   Years of education: Not on file   Highest education level: Not on file  Occupational History   Not on file  Tobacco Use   Smoking status: Former    Types: E-cigarettes   Smokeless tobacco: Never  Vaping Use   Vaping status: Never Used  Substance and Sexual Activity   Alcohol use: Yes    Alcohol/week: 1.0 standard drink of alcohol    Types: 1 Cans of beer per week    Comment: Weekends   Drug use: Not Currently     Frequency: 7.0 times per week    Types: Marijuana   Sexual activity: Not Currently    Partners: Male    Birth control/protection: None    Comment: declined condoms  Other Topics Concern   Not on file  Social History Narrative   Not on file   Social Drivers of Health   Financial Resource Strain: Not on file  Food Insecurity: Not on file  Transportation Needs: Not on file  Physical Activity: Not on file  Stress: Not on file  Social Connections: Not on file    Labs: Lab Results  Component Value Date   HIV1RNAQUANT Not Detected 08/28/2022   HIV1RNAQUANT Not Detected 02/20/2022   HIV1RNAQUANT Not Detected 07/31/2021   CD4TABS 519 08/28/2022   CD4TABS 629 02/20/2022   CD4TABS 543 09/24/2020    RPR and STI Lab Results  Component Value Date   LABRPR NON-REACTIVE 08/28/2022   LABRPR NON-REACTIVE 07/31/2021   LABRPR NON-REACTIVE 09/24/2020   LABRPR NON-REACTIVE 06/07/2020   LABRPR NON-REACTIVE 12/07/2019   RPRTITER 1:2 (H) 06/10/2017   RPRTITER 1:32 (H) 01/30/2017    STI Results GC CT  09/24/2020  2:33 PM Negative  Negative   06/07/2020 11:59 AM Negative  Negative   06/07/2020 11:57 AM Negative  Negative   12/07/2019  3:27 PM  Negative  Negative   03/04/2018 12:00 AM Negative  Negative   09/01/2017 12:00 AM Negative  Negative   02/06/2017 12:00 AM Negative  Negative   01/30/2017 12:00 AM Negative    Negative  Negative    Negative     Hepatitis B Lab Results  Component Value Date   HEPBSAB NON-REACTIVE 08/28/2022   HEPBSAG NON-REACTIVE 01/30/2017   HEPBCAB NON-REACTIVE 01/30/2017   Hepatitis C Lab Results  Component Value Date   HEPCAB NON-REACTIVE 01/30/2017   Hepatitis A Lab Results  Component Value Date   HAV REACTIVE (A) 01/30/2017   Lipids: Lab Results  Component Value Date   CHOL 195 08/28/2022   TRIG 40 08/28/2022   HDL 57 08/28/2022   CHOLHDL 3.4 08/28/2022   LDLCALC 126 (H) 08/28/2022    Current HIV Regimen: Biktarvy  (last filled  11/2023)  Assessment: Kiegan presents to clinic today for HIV follow-up after multiple missed visits with Corean; states ***. Has been taking Biktarvy  daily up until ***; [facility]. Will check HIV RNA, CD4 count, CMP, CBC, and lipid panel today. Will refill Biktarvy . Follow up with Corean in *** months. *** STI testing.   Eligible for HBV, flu, COVID, and Shingles vaccines; ***.   Plan: - Refill Biktarvy  - Check HIV RNA, CD4 count, CMP, CBC, and lipid panel  - Follow-up with Corean on ***  Alan Geralds, PharmD, CPP, BCIDP, AAHIVP Clinical Pharmacist Practitioner Infectious Diseases Clinical Pharmacist [facility] 01/13/2024, 11:46 AM

## 2024-01-13 NOTE — Telephone Encounter (Signed)
 Appt 12/11

## 2024-01-14 ENCOUNTER — Other Ambulatory Visit: Payer: Self-pay

## 2024-01-14 ENCOUNTER — Encounter: Payer: Self-pay | Admitting: Infectious Diseases

## 2024-01-14 ENCOUNTER — Ambulatory Visit: Admitting: Infectious Diseases

## 2024-01-14 ENCOUNTER — Ambulatory Visit

## 2024-01-14 ENCOUNTER — Other Ambulatory Visit (HOSPITAL_COMMUNITY)
Admission: RE | Admit: 2024-01-14 | Discharge: 2024-01-14 | Disposition: A | Source: Ambulatory Visit | Attending: Infectious Diseases | Admitting: Infectious Diseases

## 2024-01-14 ENCOUNTER — Other Ambulatory Visit

## 2024-01-14 VITALS — BP 122/69 | HR 70 | Temp 97.3°F | Ht 70.0 in | Wt 169.0 lb

## 2024-01-14 DIAGNOSIS — Z21 Asymptomatic human immunodeficiency virus [HIV] infection status: Secondary | ICD-10-CM | POA: Diagnosis not present

## 2024-01-14 DIAGNOSIS — Z23 Encounter for immunization: Secondary | ICD-10-CM | POA: Diagnosis not present

## 2024-01-14 DIAGNOSIS — Z113 Encounter for screening for infections with a predominantly sexual mode of transmission: Secondary | ICD-10-CM | POA: Diagnosis present

## 2024-01-14 MED ORDER — BICTEGRAVIR-EMTRICITAB-TENOFOV 50-200-25 MG PO TABS: 1.0000 | ORAL_TABLET | Freq: Every day | ORAL | 11 refills | Status: AC

## 2024-01-14 NOTE — Progress Notes (Signed)
 Subjective:    Patient ID: Jose Davies, male    DOB: 12/25/2000, 24 y.o.   MRN: [id number]   Chief Complaint  Patient presents with   Follow-up    B20     Discussed the use of AI scribe software for clinical note transcription with the patient, who gave verbal consent to proceed.  History of Present Illness   Jose Davies is a 23 year old male with HIV who presents for routine follow-up care.  He is experiencing difficulty in consistently taking his HIV medication, Biktarvy , due to life disruptions and stressors. He has missed several doses and has not taken his medication today as he is currently out of it. He last took his medication consistently a few months ago.  He describes significant instability in his life, including living in and out of his car and staying temporarily with his husband. He has recently started training for a new job at [employer] but encountered issues with his ID, which delayed his ability to work. His main source of income has been through [employer]. He is dealing with financial stress, including high car insurance and payments, and the desire to find stable housing. He has been living in [place] temporarily, taking showers at [employer], and is hopeful to start working soon to improve his situation.  He has not been sexually active for over a year and is not concerned about any sexual health issues at this time. He wants to return to a more stable life and is optimistic about his future despite the challenges he faces.        No Known Allergies    Outpatient Medications Prior to Visit  Medication Sig Dispense Refill   omeprazole  (PRILOSEC) 40 MG capsule Take 1 capsule (40 mg total) by mouth daily. 30 capsule 1   bictegravir-emtricitabine-tenofovir AF (BIKTARVY ) 50-200-25 MG TABS tablet TAKE 1 TABLET BY MOUTH AT THE SAME TIME EVERY DAY WITH OR WITHOUT FOOD 30 tablet 5   ondansetron  (ZOFRAN -ODT) 4 MG disintegrating tablet Take 1 tablet (4 mg total)  by mouth every 8 (eight) hours as needed for nausea or vomiting. 30 tablet 0   sucralfate  (CARAFATE ) 1 g tablet Take 1 tablet (1 g total) by mouth 4 (four) times daily as needed. (Patient not taking: Reported on 01/14/2024) 30 tablet 0   No facility-administered medications prior to visit.     Past Medical History:  Diagnosis Date   ADHD    HIV (human immunodeficiency virus infection) (HCC) 01/30/2017    No past surgical history on file.    Review of Systems  Constitutional:  Negative for chills and fever.  HENT:  Negative for sore throat.   Eyes:  Negative for visual disturbance.  Gastrointestinal:  Negative for abdominal pain, anal bleeding and rectal pain.  Genitourinary:  Negative for dysuria, genital sores, penile discharge, penile pain, scrotal swelling and testicular pain.  Musculoskeletal:  Negative for arthralgias and joint swelling.  Skin:  Negative for rash.  Neurological:  Negative for headaches.  Hematological:  Negative for adenopathy.        Objective:    BP 122/69   Pulse 70   Temp (!) 97.3 F (36.3 C) (Temporal)   Ht 5' 10 (1.778 m)   Wt 169 lb (76.7 kg)   SpO2 100%   BMI 24.25 kg/m  Nursing note and vital signs reviewed.  Physical Exam Constitutional:      Appearance: Normal appearance. He is not ill-appearing.  HENT:  Head: Normocephalic.     Mouth/Throat:     Mouth: Mucous membranes are moist.     Pharynx: Oropharynx is clear.  Eyes:     General: No scleral icterus. Cardiovascular:     Rate and Rhythm: Normal rate and regular rhythm.  Pulmonary:     Effort: Pulmonary effort is normal.  Musculoskeletal:        General: Normal range of motion.     Cervical back: Normal range of motion.  Skin:    Coloration: Skin is not jaundiced or pale.  Neurological:     Mental Status: He is alert and oriented to person, place, and time.  Psychiatric:        Mood and Affect: Mood normal.        Judgment: Judgment normal.          Assessment & Plan:  Assessment and Plan    Asymptomatic human immunodeficiency virus (HIV) infection - Asymptomatic HIV infection with inconsistent adherence to Biktarvy  due to life disruptions and stressors. Concerns about potential resistance due to spaced-out dosing. No current sexual activity reported. He expresses willingness to resume Biktarvy  for treatment. - Prescribed Biktarvy  with a year's supply and provided a week of samples. - Ordered CD4 count and viral load to assess immune status and HIV control. - Scheduled follow-up in 4-6 weeks to reassess treatment efficacy and adherence. - Discussed the importance of consistent medication adherence to prevent resistance.   Resource Assistance -  currently staying in car and amongst friends periodically. Expresses frustration with process for housing while in [place] and anticipates similar trouble now. Staying in [place] area with anticipated work there. Will not be eligible for local CM services here but may be able to find something local if care transferred to local ID clinic. He will consider and let me know if any support is needed   Sexually transmitted infection screening - No current sexual activity reported. Routine screening for sexually transmitted infections is planned as part of standard care. - Ordered routine urine test and syphilis test as part of standard screening.  Immunization management - Due for flu shot and pneumonia booster. HPV vaccination series completed. - Administered flu shot. - Administered pneumonia booster.      Meds ordered this encounter  Medications   bictegravir-emtricitabine-tenofovir AF (BIKTARVY ) 50-200-25 MG TABS tablet    Sig: Take 1 tablet by mouth daily. Try to take at the same time each day with or without food.    Dispense:  30 tablet    Refill:  11    Prescription Type::   Renewal   Orders Placed This Encounter  Procedures   Pneumococcal conjugate vaccine 20-valent   Flu vaccine  trivalent PF, 6mos and older(Flulaval,Afluria,Fluarix,Fluzone)   RPR   Comprehensive metabolic panel   Lipid panel   CBC with Differential   HIV RNA, RTPCR W/R GT (RTI, PI,INT)   T-helper cells (CD4) count   Return in about 6 weeks (around 02/25/2024).   Corean Fireman, MSN, NP-C [facility] for Infectious Disease [facility]  [email or web address] Pager: [phone] Office: [phone] [facility] Main Line: [phone]

## 2024-01-14 NOTE — Patient Instructions (Addendum)
 Will put you back on biktarvy  once daily   Update your labs today and I will let you know on mychart how they look   Let's plan to get you back in 1-2 months to make sure you are back to being undetectable on the medication

## 2024-01-15 LAB — T-HELPER CELLS (CD4) COUNT (NOT AT ARMC)
CD4 % Helper T Cell: 47 % (ref 33–65)
CD4 T Cell Abs: 670 /uL (ref 400–1790)

## 2024-01-15 LAB — URINE CYTOLOGY ANCILLARY ONLY
Chlamydia: NEGATIVE
Comment: NEGATIVE
Comment: NORMAL
Neisseria Gonorrhea: NEGATIVE

## 2024-01-19 LAB — COMPREHENSIVE METABOLIC PANEL WITH GFR
AG Ratio: 2 (calc) (ref 1.0–2.5)
ALT: 12 U/L (ref 9–46)
AST: 17 U/L (ref 10–40)
Albumin: 4.3 g/dL (ref 3.6–5.1)
Alkaline phosphatase (APISO): 40 U/L (ref 36–130)
BUN: 8 mg/dL (ref 7–25)
CO2: 29 mmol/L (ref 20–32)
Calcium: 9.3 mg/dL (ref 8.6–10.3)
Chloride: 107 mmol/L (ref 98–110)
Creat: 1.01 mg/dL (ref 0.60–1.24)
Globulin: 2.1 g/dL (ref 1.9–3.7)
Glucose, Bld: 61 mg/dL — ABNORMAL LOW (ref 65–99)
Potassium: 4.5 mmol/L (ref 3.5–5.3)
Sodium: 141 mmol/L (ref 135–146)
Total Bilirubin: 0.2 mg/dL (ref 0.2–1.2)
Total Protein: 6.4 g/dL (ref 6.1–8.1)
eGFR: 107 mL/min/1.73m2 (ref >=60)

## 2024-01-19 LAB — CBC WITH DIFFERENTIAL/PLATELET
Absolute Lymphocytes: 1586 {cells}/uL (ref 850–3900)
Absolute Monocytes: 221 {cells}/uL (ref 200–950)
Basophils Absolute: 21 {cells}/uL (ref 0–200)
Basophils Relative: 0.6 %
Eosinophils Absolute: 130 {cells}/uL (ref 15–500)
Eosinophils Relative: 3.7 %
HCT: 42.4 % (ref 39.4–51.1)
Hemoglobin: 13.9 g/dL (ref 13.2–17.1)
MCH: 29.1 pg (ref 27.0–33.0)
MCHC: 32.8 g/dL (ref 31.6–35.4)
MCV: 88.9 fL (ref 81.4–101.7)
MPV: 11.1 fL (ref 7.5–12.5)
Monocytes Relative: 6.3 %
Neutro Abs: 1544 {cells}/uL (ref 1500–7800)
Neutrophils Relative %: 44.1 %
Platelets: 227 Thousand/uL (ref 140–400)
RBC: 4.77 Million/uL (ref 4.20–5.80)
RDW: 12.9 % (ref 11.0–15.0)
Total Lymphocyte: 45.3 %
WBC: 3.5 Thousand/uL — ABNORMAL LOW (ref 3.8–10.8)

## 2024-01-19 LAB — LIPID PANEL
Cholesterol: 190 mg/dL (ref <200)
HDL: 83 mg/dL (ref >=40)
LDL Cholesterol (Calc): 95 mg/dL
Non-HDL Cholesterol (Calc): 107 mg/dL (ref <130)
Total CHOL/HDL Ratio: 2.3 (calc) (ref <5.0)
Triglycerides: 40 mg/dL (ref <150)

## 2024-01-19 LAB — HIV RNA, RTPCR W/R GT (RTI, PI,INT)
HIV 1 RNA Quant: NOT DETECTED {copies}/mL
HIV-1 RNA Quant, Log: NOT DETECTED {Log_copies}/mL

## 2024-01-19 LAB — SYPHILIS: RPR W/REFLEX TO RPR TITER AND TREPONEMAL ANTIBODIES, TRADITIONAL SCREENING AND DIAGNOSIS ALGORITHM: RPR Ser Ql: NONREACTIVE

## 2024-01-20 ENCOUNTER — Ambulatory Visit: Payer: Self-pay | Admitting: Infectious Diseases

## 2024-02-15 ENCOUNTER — Telehealth: Payer: Self-pay

## 2024-02-15 NOTE — Telephone Encounter (Signed)
 Notified by front desk that patient stated pharmacy denied his refill. He should have 11 refills on file.  He is in [place], but states he will be in [place] in a few days and will pick up his medication then.   Leigha Olberding, BSN, RN

## 2024-02-18 ENCOUNTER — Other Ambulatory Visit (HOSPITAL_COMMUNITY)
Admission: RE | Admit: 2024-02-18 | Discharge: 2024-02-18 | Disposition: A | Discharge status: Home / Self Care | Source: Ambulatory Visit | Attending: Infectious Diseases | Admitting: Infectious Diseases

## 2024-02-18 ENCOUNTER — Encounter: Payer: Self-pay | Admitting: Infectious Diseases

## 2024-02-18 ENCOUNTER — Other Ambulatory Visit: Payer: Self-pay

## 2024-02-18 ENCOUNTER — Ambulatory Visit (INDEPENDENT_AMBULATORY_CARE_PROVIDER_SITE_OTHER): Payer: Self-pay | Admitting: Infectious Diseases

## 2024-02-18 VITALS — BP 105/72 | HR 56 | Temp 97.2°F | Ht 70.0 in | Wt 172.0 lb

## 2024-02-18 DIAGNOSIS — Z113 Encounter for screening for infections with a predominantly sexual mode of transmission: Secondary | ICD-10-CM | POA: Insufficient documentation

## 2024-02-18 DIAGNOSIS — Z21 Asymptomatic human immunodeficiency virus [HIV] infection status: Secondary | ICD-10-CM | POA: Diagnosis present

## 2024-02-18 NOTE — Patient Instructions (Signed)
 Will see you back in about 4 months - can see you in between if something comes up just give us  a call.   Please continue your biktarvy  everyday

## 2024-02-18 NOTE — Progress Notes (Signed)
 "  Subjective:    Patient ID: Jose Davies, male    DOB: 2000/12/14, 24 y.o.   MRN: [id number]   Chief Complaint  Patient presents with   Follow-up     Discussed the use of AI scribe software for clinical note transcription with the patient, who gave verbal consent to proceed.  History of Present Illness   Jose Davies is a 24 year old male with HIV who presents for a six-week follow-up after resuming Biktarvy .   He is here for a six-week follow-up after restarting Biktarvy . His viral load was undetectable at the time of his last lab results. He has no issues with picking up his medication and plans to collect it after the appointment. He reports no symptoms. His most recent CD4 count and cholesterol levels were reviewed.  He is also here to check on his social situation. Previously, he was living in his car and spending time in [place]. There have been no changes in his housing situation, and he continues to move between locations. He has contacted resources discussed in a previous visit and received responses, but he feels he is 'fine now' and does not require further assistance at this time.  He requests asymptomatic STI screening with urine and oral swab. He has received all three HPV vaccines.  During the review of symptoms, he reports no symptoms and denies any changes since the last visit. He mentions that his blood sugar was slightly low, but attributes this to his low sugar intake and reports no symptoms related to this.        No Known Allergies    Outpatient Medications Prior to Visit  Medication Sig Dispense Refill   bictegravir-emtricitabine-tenofovir AF (BIKTARVY ) 50-200-25 MG TABS tablet Take 1 tablet by mouth daily. Try to take at the same time each day with or without food. 30 tablet 11   omeprazole  (PRILOSEC) 40 MG capsule Take 1 capsule (40 mg total) by mouth daily. 30 capsule 1   ondansetron  (ZOFRAN -ODT) 4 MG disintegrating tablet Take 1 tablet (4 mg total)  by mouth every 8 (eight) hours as needed for nausea or vomiting. 30 tablet 0   No facility-administered medications prior to visit.     Past Medical History:  Diagnosis Date   ADHD    HIV (human immunodeficiency virus infection) (HCC) 01/30/2017    No past surgical history on file.    Review of Systems  Constitutional:  Negative for chills and fever.  HENT:  Negative for sore throat.   Eyes:  Negative for visual disturbance.  Gastrointestinal:  Negative for abdominal pain, anal bleeding and rectal pain.  Genitourinary:  Negative for dysuria, genital sores, penile discharge, penile pain, scrotal swelling and testicular pain.  Musculoskeletal:  Negative for arthralgias and joint swelling.  Skin:  Negative for rash.  Neurological:  Negative for headaches.  Hematological:  Negative for adenopathy.        Objective:    BP 105/72   Pulse (!) 56   Temp (!) 97.2 F (36.2 C) (Temporal)   Ht 5' 10 (1.778 m)   Wt 172 lb (78 kg)   SpO2 100%   BMI 24.68 kg/m  Nursing note and vital signs reviewed.  Physical Exam Constitutional:      Appearance: Normal appearance. He is not ill-appearing.  HENT:     Head: Normocephalic.     Mouth/Throat:     Mouth: Mucous membranes are moist.     Pharynx: Oropharynx is clear.  Eyes:  General: No scleral icterus. Cardiovascular:     Rate and Rhythm: Normal rate and regular rhythm.  Pulmonary:     Effort: Pulmonary effort is normal.  Musculoskeletal:        General: Normal range of motion.     Cervical back: Normal range of motion.  Skin:    Coloration: Skin is not jaundiced or pale.  Neurological:     Mental Status: He is alert and oriented to person, place, and time.  Psychiatric:        Mood and Affect: Mood normal.        Judgment: Judgment normal.         Assessment & Plan:     Screening for sexually transmitted infections - Asymptomatic STI screening requested. - Performed urine and oral swab for STI  screening.  HIV infection - Well-controlled with undetectable viral load and good CD4 count. No issues with medication adherence or insurance coverage for Biktarvy . Continues to experience housing instability but has accessed resources. - Continue Biktarvy  as prescribed. - Scheduled follow-up appointment in 3-4 months.      No orders of the defined types were placed in this encounter.  No orders of the defined types were placed in this encounter.  Return in about 4 months (around 06/17/2024).   Jose Fireman, MSN, NP-C [facility] for Infectious Disease [facility]  [email or web address] Pager: [phone] Office: [phone] [facility] Main Line: [phone]  "

## 2024-02-19 LAB — URINE CYTOLOGY ANCILLARY ONLY
Chlamydia: NEGATIVE
Comment: NEGATIVE
Comment: NORMAL
Neisseria Gonorrhea: NEGATIVE

## 2024-02-19 LAB — CYTOLOGY, (ORAL, ANAL, URETHRAL) ANCILLARY ONLY
Chlamydia: NEGATIVE
Comment: NEGATIVE
Comment: NORMAL
Neisseria Gonorrhea: NEGATIVE

## 2024-02-29 ENCOUNTER — Ambulatory Visit: Payer: Self-pay | Admitting: Infectious Diseases

## 2024-03-28 ENCOUNTER — Ambulatory Visit: Admitting: Family Medicine

## 2024-04-01 ENCOUNTER — Ambulatory Visit: Admitting: Family Medicine

## 2024-06-09 ENCOUNTER — Ambulatory Visit: Payer: Self-pay | Admitting: Infectious Diseases
